# Patient Record
Sex: Male | Born: 1970
Health system: Southern US, Community
[De-identification: ages and names within clinical notes are randomized; demographics above are authoritative.]

## PROBLEM LIST (undated history)

## (undated) DIAGNOSIS — C801 Malignant (primary) neoplasm, unspecified: Secondary | ICD-10-CM

## (undated) DIAGNOSIS — Z923 Personal history of irradiation: Secondary | ICD-10-CM

## (undated) DIAGNOSIS — T4145XA Adverse effect of unspecified anesthetic, initial encounter: Secondary | ICD-10-CM

## (undated) DIAGNOSIS — I10 Essential (primary) hypertension: Secondary | ICD-10-CM

## (undated) DIAGNOSIS — C6290 Malignant neoplasm of unspecified testis, unspecified whether descended or undescended: Secondary | ICD-10-CM

## (undated) DIAGNOSIS — T8859XA Other complications of anesthesia, initial encounter: Secondary | ICD-10-CM

## (undated) HISTORY — DX: Malignant (primary) neoplasm, unspecified: C80.1

## (undated) HISTORY — DX: Personal history of irradiation: Z92.3

## (undated) HISTORY — DX: Malignant neoplasm of unspecified testis, unspecified whether descended or undescended: C62.90

## (undated) HISTORY — PX: EYE SURGERY: SHX253

## (undated) HISTORY — DX: Essential (primary) hypertension: I10

---
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
## 2010-05-12 ENCOUNTER — Ambulatory Visit (HOSPITAL_BASED_OUTPATIENT_CLINIC_OR_DEPARTMENT_OTHER)
Admission: RE | Admit: 2010-05-12 | Discharge: 2010-05-12 | Payer: Self-pay | Source: Home / Self Care | Attending: Internal Medicine | Admitting: Internal Medicine

## 2010-05-12 ENCOUNTER — Encounter: Payer: Self-pay | Admitting: Internal Medicine

## 2010-05-12 ENCOUNTER — Telehealth: Payer: Self-pay | Admitting: Internal Medicine

## 2010-05-12 ENCOUNTER — Ambulatory Visit: Payer: Self-pay | Admitting: Internal Medicine

## 2010-05-12 DIAGNOSIS — I10 Essential (primary) hypertension: Secondary | ICD-10-CM

## 2010-05-12 DIAGNOSIS — C6290 Malignant neoplasm of unspecified testis, unspecified whether descended or undescended: Secondary | ICD-10-CM | POA: Insufficient documentation

## 2010-05-12 LAB — CONVERTED CEMR LAB
AST: 22 units/L (ref 0–37)
Albumin: 4.6 g/dL (ref 3.5–5.2)
Alkaline Phosphatase: 68 units/L (ref 39–117)
Calcium: 9.1 mg/dL (ref 8.4–10.5)
Cholesterol: 188 mg/dL (ref 0–200)
Creatinine, Ser: 0.8 mg/dL (ref 0.40–1.50)
Glucose, Bld: 77 mg/dL (ref 70–99)
HCT: 47.4 % (ref 39.0–52.0)
LDH: 185 units/L (ref 94–250)
RBC: 5.24 M/uL (ref 4.22–5.81)
RDW: 12.4 % (ref 11.5–15.5)
TSH: 1.504 microintl units/mL (ref 0.350–4.500)
Total Bilirubin: 0.6 mg/dL (ref 0.3–1.2)
Total Protein: 7 g/dL (ref 6.0–8.3)
VLDL: 18 mg/dL (ref 0–40)
WBC: 6.1 10*3/uL (ref 4.0–10.5)
hCG, Beta Chain, Quant, S: <2 milliintl units/mL

## 2010-05-13 ENCOUNTER — Encounter: Payer: Self-pay | Admitting: Internal Medicine

## 2010-05-17 ENCOUNTER — Encounter: Payer: Self-pay | Admitting: Internal Medicine

## 2010-05-19 ENCOUNTER — Ambulatory Visit
Admission: RE | Admit: 2010-05-19 | Discharge: 2010-05-19 | Payer: Self-pay | Source: Home / Self Care | Attending: Urology | Admitting: Urology

## 2010-05-19 ENCOUNTER — Encounter: Payer: Self-pay | Admitting: Internal Medicine

## 2010-05-19 DIAGNOSIS — C801 Malignant (primary) neoplasm, unspecified: Secondary | ICD-10-CM

## 2010-05-19 HISTORY — DX: Malignant (primary) neoplasm, unspecified: C80.1

## 2010-05-19 HISTORY — PX: TESTICULAR EXPLORATION: SHX5145

## 2010-05-19 HISTORY — PX: ORCHIECTOMY: SHX2116

## 2010-06-02 ENCOUNTER — Ambulatory Visit
Admission: RE | Admit: 2010-06-02 | Discharge: 2010-06-28 | Payer: Self-pay | Source: Home / Self Care | Attending: Radiation Oncology | Admitting: Radiation Oncology

## 2010-06-02 ENCOUNTER — Encounter: Payer: Self-pay | Admitting: Internal Medicine

## 2010-06-03 ENCOUNTER — Encounter: Payer: Self-pay | Admitting: Internal Medicine

## 2010-06-29 ENCOUNTER — Ambulatory Visit: Payer: BC Managed Care – PPO | Attending: Radiation Oncology | Admitting: Radiation Oncology

## 2010-06-29 DIAGNOSIS — Z510 Encounter for antineoplastic radiation therapy: Secondary | ICD-10-CM | POA: Insufficient documentation

## 2010-06-29 DIAGNOSIS — R110 Nausea: Secondary | ICD-10-CM | POA: Insufficient documentation

## 2010-06-29 DIAGNOSIS — C6290 Malignant neoplasm of unspecified testis, unspecified whether descended or undescended: Secondary | ICD-10-CM | POA: Insufficient documentation

## 2010-06-30 NOTE — Letter (Signed)
   [place] at [facility] [address] [place], [place]  [address]  [place] Phone: [phone]      May 13, 2010   Johaan Triggs [address] [place], [place] [address]  RE:  LAB RESULTS  Dear  Mr. Barbara,  The following is an interpretation of your most recent lab tests.  Please take note of any instructions provided or changes to medications that have resulted from your lab work.  ELECTROLYTES:  Good - no changes needed  KIDNEY FUNCTION TESTS:  Good - no changes needed  LIVER FUNCTION TESTS:  Good - no changes needed  LIPID PANEL:  Fair - review at your next visit Triglyceride: 88   Cholesterol: 188   LDL: 127   HDL: 43   Chol/HDL%:  4.4 Ratio  THYROID STUDIES:  Thyroid studies normal TSH: 1.504     CBC:  Good - no changes needed  Tumor markers - normal       Sincerely Yours,    Dr. Thomos Lemons  Appended Document:  Mailed.

## 2010-06-30 NOTE — Assessment & Plan Note (Signed)
Summary: TO EST/CPX/hea   Vital Signs:  Patient profile:   40 year old male Height:      69.5 inches Weight:      187.25 pounds BMI:     27.35 O2 Sat:      100 % on Room air Temp:     98.0 degrees F oral Pulse rate:   65 / minute Resp:     20 per minute BP sitting:   142 / 100  (right arm) Cuff size:   large  Vitals Entered By: Glendell Docker CMA (May 12, 2010 9:20 AM)  O2 Flow:  Room air CC: New Patient Is Patient Diabetic? No Pain Assessment Patient in pain? no       Does patient need assistance? Functional Status Self care Comments left testicle discomfort  for the past 3-4 weeks, states that it feels hard, denies urinary symptoms of any kind   Primary Care Provider:  Dondra Spry DO  CC:  New Patient.  History of Present Illness: 40 y/o white male to establish  he reports left testicular discomfort x 3-4 weeks left testicle feels different no trauma or injury no change in sexual function, no urinary symptoms  more symptomatic after lifting - son has dirt bike that weighs 90 lbs  Preventive Screening-Counseling & Management  Alcohol-Tobacco     Alcohol drinks/day: <1     Smoking Status: never  Caffeine-Diet-Exercise     Caffeine use/day: 2 beverages daily     Does Patient Exercise: yes     Type of exercise: Run 3 - 4 miles     Times/week: 3  Allergies (verified): No Known Drug Allergies  Past History:  Past Medical History: Hypertension - diagnosed at age 40  (bp was 160/110 when pt first diagnosed) ( workup for secondary hypertension  - negative )  Past Surgical History: eye surgery age 40  Family History: Family History Diabetes 1st degree relative - MGF Family History Hypertension - mom and dad Family History of Stroke M - MGF no colon ca no breast MGM - ovarian cancer  Social History: Occupation: Medical illustrator ( lumbar business ) Married 15 years 2 boys 2 ,8 1 daughter 87 Never Smoked Alcohol use-yes (drinks mainly on weekends  - 5-6 on weekend) Smoking Status:  never Caffeine use/day:  2 beverages daily Does Patient Exercise:  yes  Review of Systems  The patient denies weight loss, weight gain, chest pain, dyspnea on exertion, abdominal pain, melena, hematochezia, and severe indigestion/heartburn.         occ heartburn with spicy foods.  no dysphagia  Physical Exam  General:  alert, well-developed, and well-nourished.   Head:  normocephalic and atraumatic.   Eyes:  pupils equal, pupils round, and pupils reactive to light.   Ears:  R ear normal and L ear normal.   Mouth:  pharynx pink and moist.   Lungs:  normal respiratory effort, normal breath sounds, no crackles, and no wheezes.   Heart:  normal rate, regular rhythm, and no murmur.   Abdomen:  soft, non-tender, normal bowel sounds, and no masses.  small umbilical hernia   Impression & Recommendations:  Problem # 1:  HYPERTENSION (ICD-401.9) restart amlodipine.  we discussed possible combination therapy  His updated medication list for this problem includes:    Amlodipine Besylate 5 Mg Tabs (Amlodipine besylate) ..... One by mouth once daily  Orders: T-Basic Metabolic Panel [phone]) T-Hepatic Function [id number]) T-Lipid Profile [id number]) T-CBC No Diff ([id number]) T-TSH [phone]) CRP, high  sensitivity-[facility] [phone])  BP today: 142/100  Problem # 2:  TESTICULAR MASS, LEFT (ICD-608.89) firm area base of left testicle.  rule out testicular cancer check AFP, B Hcg, and LDH  Orders: Ultrasound (Ultrasound) T- * Misc. Laboratory test [phone])  Complete Medication List: 1)  Amlodipine Besylate 5 Mg Tabs (Amlodipine besylate) .... One by mouth once daily  Patient Instructions: 1)  Please schedule a follow-up appointment in 1 month. Prescriptions: AMLODIPINE BESYLATE 5 MG TABS (AMLODIPINE BESYLATE) one by mouth once daily  #30 x 3   Entered and Authorized by:   D. Thomos Lemons DO   Signed by:   D. Thomos Lemons DO on  05/12/2010   Method used:   Electronically to        [facility] [phone]* (retail)       [address]       [place], [place]  [address]       Ph: [phone]       Fax: [phone]   RxID:   [id number]    Orders Added: 1)  Ultrasound [Ultrasound] 2)  T-Basic Metabolic Panel [[id number]] 3)  T-Hepatic Function [[id number]] 4)  T-Lipid Profile [[id number]] 5)  T-CBC No Diff [85027-10000] 6)  T-TSH [51884-16606] 7)  T- * Misc. Laboratory test [99999] 8)  CRP, high sensitivity-[facility] [phone] 9)  New Patient Level III [[address]203]    Current Allergies (reviewed today): No known allergies

## 2010-06-30 NOTE — Consult Note (Signed)
Summary: [facility]  [facility]   Imported By: Lanelle Bal 05/27/2010 13:10:48  _____________________________________________________________________  External Attachment:    Type:   Image     Comment:   External Document

## 2010-06-30 NOTE — Progress Notes (Signed)
Summary: Jordan Beasley on phone for you   Phone Note From Other Clinic   Caller: Jordan Beasley with Radiology  Call For: yoo Summary of Call: received call from radiologist - left testicular mass suspicous for cancer.  spoke with pt re: u/s results.  arrange urologic eval  Initial call taken by: D. Thomos Lemons DO,  May 12, 2010 1:48 PM  Follow-up for Phone Call        Appt  [facility]   Dr  Retta Diones   Dec  20th   pt notified and confirmed   Records fax Follow-up by: Darral Dash,  May 13, 2010 8:07 AM

## 2010-06-30 NOTE — Letter (Signed)
Summary: [facility]  [facility]   Imported By: Lanelle Bal 06/21/2010 13:54:23  _____________________________________________________________________  External Attachment:    Type:   Image     Comment:   External Document

## 2010-06-30 NOTE — Letter (Signed)
Summary: [facility]  [facility]   Imported By: Lanelle Bal 06/14/2010 08:53:01  _____________________________________________________________________  External Attachment:    Type:   Image     Comment:   External Document

## 2010-07-08 ENCOUNTER — Encounter: Payer: Self-pay | Admitting: Internal Medicine

## 2010-07-15 ENCOUNTER — Encounter: Payer: Self-pay | Admitting: Internal Medicine

## 2010-07-15 ENCOUNTER — Ambulatory Visit (INDEPENDENT_AMBULATORY_CARE_PROVIDER_SITE_OTHER): Payer: BC Managed Care – PPO | Admitting: Internal Medicine

## 2010-07-15 DIAGNOSIS — E785 Hyperlipidemia, unspecified: Secondary | ICD-10-CM

## 2010-07-15 DIAGNOSIS — C6290 Malignant neoplasm of unspecified testis, unspecified whether descended or undescended: Secondary | ICD-10-CM

## 2010-07-26 NOTE — Letter (Signed)
Summary: [facility]  [facility]   Imported By: Maryln Gottron 07/21/2010 13:39:42  _____________________________________________________________________  External Attachment:    Type:   Image     Comment:   External Document

## 2010-07-28 ENCOUNTER — Encounter: Payer: Self-pay | Admitting: Internal Medicine

## 2010-08-04 NOTE — Assessment & Plan Note (Signed)
Summary: 1 MONTH FOLLOW UP/MHF   Vital Signs:  Patient profile:   40 year old male Height:      69.5 inches Weight:      192.25 pounds BMI:     28.08 O2 Sat:      100 % on Room air Temp:     98.2 degrees F oral Pulse rate:   79 / minute Resp:     16 per minute BP sitting:   130 / 72  (right arm) Cuff size:   large  Vitals Entered By: Glendell Docker CMA (July 15, 2010 8:25 AM)  O2 Flow:  Room air CC: 1 Month Follow up Is Patient Diabetic? No Pain Assessment Patient in pain? no      Comments no concerns   Primary Care Provider:  Dondra Spry DO  CC:  1 Month Follow up.  History of Present Illness: 40 y/o for f/u pt diagnosed with classic seminoma s/p orchiectomy and radiation tolerated well.     reviewed labs cholesterol LDL borderline.  CRP elevated  protein source - red meat , cheese   Preventive Screening-Counseling & Management  Alcohol-Tobacco     Smoking Status: never  Allergies (verified): No Known Drug Allergies  Past History:  Past Medical History: Hypertension - diagnosed at age 37  (bp was 160/110 when pt first diagnosed) ( workup for secondary hypertension  - negative )    Past Surgical History: eye surgery age 47  SH/Risk Factors reviewed for relevance  Family History: Family History Diabetes 1st degree relative - MGF Family History Hypertension - mom and dad Family History of Stroke M - MGF no colon ca no breast MGM - ovarian cancer   Social History: Occupation: Medical illustrator ( lumbar business ) Married 15 years 2 boys 2 ,8 (son involved in motor cross / dirt bike) 1 daughter 15 (daugther plays soccer) Never Smoked Alcohol use-yes (drinks mainly on weekends - 5-6 on weekend)  Physical Exam  General:  alert, well-developed, and well-nourished.   Lungs:  normal respiratory effort, normal breath sounds, no crackles, and no wheezes.   Heart:  normal rate, regular rhythm, and no murmur.   Extremities:  No lower extremity  edema   Impression & Recommendations:  Problem # 1:  SEMINOMA (ICD-186.9) status post left orchiectomy and radiation treatment Surveillance as per urology  Problem # 2:  HYPERLIPIDEMIA (ICD-272.4) patient defers start of statin Trial of lifestyle/dietary changes Educational handout provided  Labs Reviewed: SGOT: 22 (05/12/2010)   SGPT: 18 (05/12/2010)   HDL:43 (05/12/2010)  LDL:127 (05/12/2010)  Chol:188 (05/12/2010)  Trig:88 (05/12/2010)  Complete Medication List: 1)  Amlodipine Besylate 5 Mg Tabs (Amlodipine besylate) .... One by mouth once daily  Patient Instructions: 1)  Please schedule a follow-up appointment in 6 months. 2)  Lipid Panel prior to visit, ICD-9:  272.4 3)  High sensitivity CRP :  272.4 4)  Please return for lab work one (1) week before your next appointment.    Orders Added: 1)  Est. Patient Level III [[address]]    Current Allergies (reviewed today): No known allergies

## 2010-08-08 LAB — POCT I-STAT 4, (NA,K, GLUC, HGB,HCT)
HCT: 53 % — ABNORMAL HIGH (ref 39.0–52.0)
Potassium: 3.8 mEq/L (ref 3.5–5.1)
Sodium: 144 mEq/L (ref 135–145)

## 2010-08-09 NOTE — Letter (Signed)
Summary: [facility]  [facility]   Imported By: Maryln Gottron 08/05/2010 15:33:56  _____________________________________________________________________  External Attachment:    Type:   Image     Comment:   External Document

## 2010-08-19 ENCOUNTER — Ambulatory Visit: Payer: BC Managed Care – PPO | Attending: Radiation Oncology | Admitting: Radiation Oncology

## 2010-09-14 ENCOUNTER — Other Ambulatory Visit: Payer: Self-pay | Admitting: Internal Medicine

## 2010-09-20 ENCOUNTER — Ambulatory Visit (HOSPITAL_COMMUNITY)
Admission: RE | Admit: 2010-09-20 | Discharge: 2010-09-20 | Disposition: A | Discharge status: Home / Self Care | Payer: BC Managed Care – PPO | Source: Ambulatory Visit | Attending: Urology | Admitting: Urology

## 2010-09-20 ENCOUNTER — Other Ambulatory Visit: Payer: Self-pay | Admitting: Urology

## 2010-09-20 DIAGNOSIS — C6290 Malignant neoplasm of unspecified testis, unspecified whether descended or undescended: Secondary | ICD-10-CM

## 2010-09-20 DIAGNOSIS — I10 Essential (primary) hypertension: Secondary | ICD-10-CM | POA: Insufficient documentation

## 2010-12-26 ENCOUNTER — Ambulatory Visit (HOSPITAL_COMMUNITY)
Admission: RE | Admit: 2010-12-26 | Discharge: 2010-12-26 | Disposition: A | Discharge status: Home / Self Care | Payer: BC Managed Care – PPO | Source: Ambulatory Visit | Attending: Urology | Admitting: Urology

## 2010-12-26 ENCOUNTER — Other Ambulatory Visit: Payer: Self-pay | Admitting: Urology

## 2010-12-26 DIAGNOSIS — C6290 Malignant neoplasm of unspecified testis, unspecified whether descended or undescended: Secondary | ICD-10-CM

## 2011-02-23 ENCOUNTER — Other Ambulatory Visit: Payer: Self-pay | Admitting: Internal Medicine

## 2011-02-24 ENCOUNTER — Other Ambulatory Visit: Payer: Self-pay | Admitting: Radiation Oncology

## 2011-02-24 ENCOUNTER — Ambulatory Visit
Admission: RE | Admit: 2011-02-24 | Discharge: 2011-02-24 | Disposition: A | Discharge status: Home / Self Care | Payer: BC Managed Care – PPO | Source: Ambulatory Visit | Attending: Radiation Oncology | Admitting: Radiation Oncology

## 2011-02-24 DIAGNOSIS — C6290 Malignant neoplasm of unspecified testis, unspecified whether descended or undescended: Secondary | ICD-10-CM

## 2011-02-28 ENCOUNTER — Other Ambulatory Visit: Payer: Self-pay | Admitting: Internal Medicine

## 2011-03-13 ENCOUNTER — Telehealth: Payer: Self-pay | Admitting: Internal Medicine

## 2011-03-13 NOTE — Telephone Encounter (Signed)
Patient has a 1 year follow up scheduled with Dr. Artist Pais on 07/26/11. Patient is unsure if he will need labs prior to visit.

## 2011-03-13 NOTE — Telephone Encounter (Signed)
cpx labs one week prior

## 2011-07-05 ENCOUNTER — Other Ambulatory Visit: Payer: Self-pay | Admitting: Urology

## 2011-07-05 ENCOUNTER — Ambulatory Visit (HOSPITAL_COMMUNITY)
Admission: RE | Admit: 2011-07-05 | Discharge: 2011-07-05 | Disposition: A | Discharge status: Home / Self Care | Payer: BC Managed Care – PPO | Source: Ambulatory Visit | Attending: Urology | Admitting: Urology

## 2011-07-05 DIAGNOSIS — C6290 Malignant neoplasm of unspecified testis, unspecified whether descended or undescended: Secondary | ICD-10-CM | POA: Insufficient documentation

## 2011-07-05 DIAGNOSIS — I10 Essential (primary) hypertension: Secondary | ICD-10-CM | POA: Insufficient documentation

## 2011-07-14 ENCOUNTER — Ambulatory Visit: Payer: BC Managed Care – PPO | Admitting: Internal Medicine

## 2011-07-19 ENCOUNTER — Other Ambulatory Visit (INDEPENDENT_AMBULATORY_CARE_PROVIDER_SITE_OTHER): Payer: BC Managed Care – PPO

## 2011-07-19 DIAGNOSIS — Z0000 Encounter for general adult medical examination without abnormal findings: Secondary | ICD-10-CM

## 2011-07-19 LAB — CBC WITH DIFFERENTIAL/PLATELET
Basophils Relative: 0.5 % (ref 0.0–3.0)
Eosinophils Relative: 3.7 % (ref 0.0–5.0)
HCT: 47.3 % (ref 39.0–52.0)
Hemoglobin: 16 g/dL (ref 13.0–17.0)
Lymphocytes Relative: 22.8 % (ref 12.0–46.0)
MCV: 91.7 fl (ref 78.0–100.0)
Monocytes Relative: 8.2 % (ref 3.0–12.0)
Neutrophils Relative %: 64.8 % (ref 43.0–77.0)
Platelets: 186 10*3/uL (ref 150.0–400.0)
RBC: 5.16 Mil/uL (ref 4.22–5.81)
RDW: 13.5 % (ref 11.5–14.6)
WBC: 4 10*3/uL — ABNORMAL LOW (ref 4.5–10.5)

## 2011-07-19 LAB — HEPATIC FUNCTION PANEL
AST: 30 U/L (ref 0–37)
Albumin: 4.5 g/dL (ref 3.5–5.2)
Alkaline Phosphatase: 59 U/L (ref 39–117)
Bilirubin, Direct: 0 mg/dL (ref 0.0–0.3)

## 2011-07-19 LAB — BASIC METABOLIC PANEL
BUN: 16 mg/dL (ref 6–23)
Creatinine, Ser: 1.1 mg/dL (ref 0.4–1.5)
GFR: 82.23 mL/min (ref >60.00)

## 2011-07-19 LAB — POCT URINALYSIS DIPSTICK
Bilirubin, UA: NEGATIVE
Blood, UA: NEGATIVE
Ketones, UA: NEGATIVE
Nitrite, UA: NEGATIVE
Protein, UA: NEGATIVE
Urobilinogen, UA: 0.2

## 2011-07-19 LAB — LIPID PANEL: Triglycerides: 111 mg/dL (ref 0.0–149.0)

## 2011-07-19 LAB — LDL CHOLESTEROL, DIRECT: Direct LDL: 150.9 mg/dL

## 2011-07-26 ENCOUNTER — Encounter: Payer: Self-pay | Admitting: Internal Medicine

## 2011-07-26 ENCOUNTER — Ambulatory Visit (INDEPENDENT_AMBULATORY_CARE_PROVIDER_SITE_OTHER): Payer: BC Managed Care – PPO | Admitting: Internal Medicine

## 2011-07-26 DIAGNOSIS — C6290 Malignant neoplasm of unspecified testis, unspecified whether descended or undescended: Secondary | ICD-10-CM

## 2011-07-26 DIAGNOSIS — Z0000 Encounter for general adult medical examination without abnormal findings: Secondary | ICD-10-CM | POA: Insufficient documentation

## 2011-07-26 DIAGNOSIS — I10 Essential (primary) hypertension: Secondary | ICD-10-CM

## 2011-07-26 MED ORDER — AMLODIPINE BESYLATE 5 MG PO TABS: 5.0000 mg | ORAL_TABLET | Freq: Every day | ORAL | Status: DC

## 2011-07-26 MED ORDER — LOSARTAN POTASSIUM 50 MG PO TABS: 50.0000 mg | ORAL_TABLET | Freq: Every day | ORAL | Status: DC

## 2011-07-26 NOTE — Assessment & Plan Note (Signed)
History of left testicular seminoma.  SP left orchiectomy and radiation treatment.  Follow up with oncology for surveillance.

## 2011-07-26 NOTE — Assessment & Plan Note (Signed)
Suboptimally controlled.  Add losartan.  Reassess in 6 wks.  BMET before OV. BP: 152/82 mmHg  Lab Results  Component Value Date   CREATININE 1.1 07/19/2011

## 2011-07-26 NOTE — Progress Notes (Signed)
  Subjective:    Patient ID: Jordan Beasley, male    DOB: 1970-09-07, 41 y.o.   MRN: [id number]  HPI  41 year old white male with history of hypertension and left testicular seminoma for routine physical. Since previous visit patient has had followup with his urologist and oncologist. He is status post left orchiectomy and multiple radiation treatments. There has been no sign of recurrence of testicular cancer. He has followup with his oncologist next month. Overall he is feeling well.  Hypertension-good medication compliance. Blood pressure is still suboptimally controlled. Home readings 140s systolic.  Laboratory results reviewed. Patient has been trying to follow a low saturated fat diet. He has significantly reduced his intake of red meats.   Review of Systems  Constitutional: Negative for activity change, appetite change and unexpected weight change.  Eyes: Negative for visual disturbance.  Respiratory: Negative for cough, chest tightness and shortness of breath.   Cardiovascular: Negative for chest pain.  Genitourinary: Negative for difficulty urinating.  Neurological: Negative for headaches.  Gastrointestinal: Negative for abdominal pain, heartburn melena or hematochezia Psych: Negative for depression or anxiety  Past Medical History  Diagnosis Date  . Hypertension   . Seminoma of testis     History   Social History  . Marital Status: Married    Spouse Name: N/A    Number of Children: N/A  . Years of Education: N/A   Occupational History  . Not on file.   Social History Main Topics  . Smoking status: Never Smoker   . Smokeless tobacco: Not on file  . Alcohol Use: Yes  . Drug Use: No  . Sexually Active: Not on file   Other Topics Concern  . Not on file   Social History Narrative  . No narrative on file    Past Surgical History  Procedure Date  . Eye surgery     age 32    Family History  Problem Relation Age of Onset  . Hypertension Mother   . Stroke  Mother   . Hypertension Father   . Cancer Maternal Grandmother     ovarian  . Diabetes Maternal Grandfather   . Stroke Maternal Grandfather   . Colon cancer Neg Hx   . Breast cancer Neg Hx     No Known Allergies  No current outpatient prescriptions on file prior to visit.    BP 152/82  Pulse 72  Temp(Src) 97.8 F (36.6 C) (Oral)  Ht 5\' 10"  (1.778 m)  Wt 190 lb (86.183 kg)  BMI 27.26 kg/m2     Objective:   Physical Exam   Constitutional: Appears well-developed and well-nourished. No distress.  Head: Normocephalic and atraumatic.  Ear:  Right and left ear normal.  TMs clear.  Hearing is grossly normal Mouth/Throat: Oropharynx is clear and moist.  Eyes: Conjunctivae are normal. Pupils are equal, round, and reactive to light.  Neck: Normal range of motion. Neck supple. No thyromegaly present. No carotid bruit Cardiovascular: Normal rate, regular rhythm and normal heart sounds.  Exam reveals no gallop and no friction rub.  No murmur heard. Pulmonary/Chest: Effort normal and breath sounds normal.  No wheezes. No rales.  Abdominal: Soft. Bowel sounds are normal. No mass. There is no tenderness.  Neurological: Alert. No cranial nerve deficit.  Skin: Skin is warm and dry.  Psychiatric: Normal mood and affect. Behavior is normal.      Assessment & Plan:

## 2011-07-26 NOTE — Patient Instructions (Signed)
Please complete the following lab tests before your next follow up appointment: BMET - 401.9 High sensitivity CRP - 272.4

## 2011-07-26 NOTE — Assessment & Plan Note (Signed)
Reviewed adult health maintenance protocols.  Continue low saturated fat diet.   Further risk stratify with CRP.  We discussed possibly starting statin.

## 2011-08-21 ENCOUNTER — Ambulatory Visit (HOSPITAL_COMMUNITY)
Admission: RE | Admit: 2011-08-21 | Discharge: 2011-08-21 | Disposition: A | Discharge status: Home / Self Care | Payer: BC Managed Care – PPO | Source: Ambulatory Visit | Attending: Radiation Oncology | Admitting: Radiation Oncology

## 2011-08-21 DIAGNOSIS — K7689 Other specified diseases of liver: Secondary | ICD-10-CM | POA: Insufficient documentation

## 2011-08-21 DIAGNOSIS — C6290 Malignant neoplasm of unspecified testis, unspecified whether descended or undescended: Secondary | ICD-10-CM | POA: Insufficient documentation

## 2011-08-21 DIAGNOSIS — Q619 Cystic kidney disease, unspecified: Secondary | ICD-10-CM | POA: Insufficient documentation

## 2011-08-21 MED ORDER — IOHEXOL 300 MG/ML  SOLN
100.0000 mL | Freq: Once | INTRAMUSCULAR | Status: AC | PRN
  Administered 2011-08-21: 100 mL via INTRAVENOUS

## 2011-08-22 ENCOUNTER — Encounter: Payer: Self-pay | Admitting: *Deleted

## 2011-08-22 DIAGNOSIS — Z923 Personal history of irradiation: Secondary | ICD-10-CM | POA: Insufficient documentation

## 2011-08-25 ENCOUNTER — Ambulatory Visit
Admission: RE | Admit: 2011-08-25 | Discharge: 2011-08-25 | Disposition: A | Discharge status: Home / Self Care | Payer: BC Managed Care – PPO | Source: Ambulatory Visit | Attending: Radiation Oncology | Admitting: Radiation Oncology

## 2011-08-25 ENCOUNTER — Encounter: Payer: Self-pay | Admitting: Radiation Oncology

## 2011-08-25 VITALS — BP 154/92 | HR 63 | Temp 98.3°F | Resp 20 | Ht 73.0 in | Wt 186.7 lb

## 2011-08-25 DIAGNOSIS — C6290 Malignant neoplasm of unspecified testis, unspecified whether descended or undescended: Secondary | ICD-10-CM

## 2011-08-25 NOTE — Progress Notes (Signed)
Radiation Oncology         [phone] ________________________________  Name: Jordan Beasley MRN: [id number]  Date: 08/25/2011  DOB: 19-Feb-1971  Follow-Up Visit Note  CC: Thomos Lemons, DO, DO  Dahlstedt, Jeannett Senior, MD  Diagnosis:   Classical seminoma of the left testis, T2 N0  Interval Since Last Radiation:  13 months   Narrative:  The patient returns today for routine follow-up.  Patient indicates that he is doing very well. He has no significant complaints today. No nausea and no diarrhea. The patient denies any significant pain which is new or severe. The patient did have a CT scan of the abdomen and pelvis which was completed on 08/21/2011. There is no indication of metastatic disease within the abdomen or pelvis.                              ALLERGIES:   has no known allergies.  Meds: Current Outpatient Prescriptions  Medication Sig Dispense Refill  . amLODipine (NORVASC) 5 MG tablet Take 1 tablet (5 mg total) by mouth daily.  90 tablet  1  . losartan (COZAAR) 50 MG tablet Take 1 tablet (50 mg total) by mouth daily.  30 tablet  2    Physical Findings: The patient is in no acute distress. Patient is alert and oriented.  height is 6\' 1"  (1.854 m) and weight is 186 lb 11.2 oz (84.687 kg). His oral temperature is 98.3 F (36.8 C). His blood pressure is 154/92 and his pulse is 63. His respiration is 20. Marland Kitchen   General: Well-developed and in no acute distress Neck: Supple without any lymphadenopathy Cardiovascular: Regular rate and rhythm Respiratory: Clear to auscultation bilaterally GI: Soft, nontender, normal bowel sounds Extremities: No edema present Neuro: No focal deficits GU exam deferred due to recent exam by Dr. Retta Diones   Lab Findings: Lab Results  Component Value Date   WBC 4.0* 07/19/2011   HGB 16.0 07/19/2011   HCT 47.3 07/19/2011   MCV 91.7 07/19/2011   PLT 186.0 07/19/2011     Radiographic Findings: Ct Abdomen Pelvis W Contrast  08/21/2011  *RADIOLOGY REPORT*   Clinical Data: History of testicular cancer.  Status post left orchiectomy.  CT ABDOMEN AND PELVIS WITH CONTRAST  Technique:  Multidetector CT imaging of the abdomen and pelvis was performed following the standard protocol during bolus administration of intravenous contrast.  Contrast:  100 ml of Omnipaque-300.  Comparison: CT of abdomen and pelvis 05/18/2010.  Findings:  Lung Bases: Image 3 of series 5 demonstrates a 3 mm subpleural nodule in the anterior aspect of the right lower lobe.  This nodule is completely unchanged in retrospect compared to prior CT scan 05/18/2010, and is favored to represent a benign subpleural lymph node.  No other suspicious appearing pulmonary nodule or mass is identified within the visualized portions of the lung bases.  Abdomen/Pelvis:  Again noted is a 7 mm well circumscribed low attenuation lesion in segment 7 of the liver (image 17 of series 2), which is unchanged compared to the prior examination.  No new hepatic lesions are otherwise noted.  The infused appearance of the gallbladder, pancreas, spleen, bilateral adrenal glands and bilateral kidneys is unremarkable.  No abnormal soft tissue masses or pathologic adenopathy are noted within the abdomen or pelvis. No ascites or pneumoperitoneum and no pathologic distension of bowel.  Retroaortic left renal vein (normal anatomical variant) incidentally noted.  Postoperative changes of left-sided orchiectomy.  Musculoskeletal: There are no aggressive appearing lytic or blastic lesions noted in the visualized portions of the skeleton.  IMPRESSION: 1.  No findings to suggest metastatic disease in the abdomen or pelvis on today's examination. 2.  Unchanged 7 mm low attenuation lesion in segment 7 of the liver remains too small to characterize, however, its stability in size over time would suggest a benign lesion such as a small cyst. 3.  While there is a tiny 3 mm subpleural nodule in the anterior aspect of the right lower lobe, this is  completely unchanged in retrospect compared to prior study 05/18/2010, and is therefore favored to represent a benign subpleural lymph node.  No imaging follow-up for this nodule is recommended at this time.  Original Report Authenticated By: Florencia Reasons, M.D.    Impression:    The patient is doing very well with no signs of recurrence or metastatic disease.  Plan:  The patient will return to clinic in one year after undergoing a repeat CT scan of the abdomen and pelvis.  I spent 10 minutes with the patient today, the majority of which was spent counseling the patient on the diagnosis of cancer and coordinating care.   Radene Gunning, M.D., Ph.D.

## 2011-08-25 NOTE — Progress Notes (Signed)
Pt denies issues w.appetite, energy, bowels, bladder. Working full-time, exercising.

## 2011-08-29 ENCOUNTER — Telehealth: Payer: Self-pay | Admitting: *Deleted

## 2011-08-29 NOTE — Telephone Encounter (Signed)
XXXX 

## 2011-09-04 ENCOUNTER — Other Ambulatory Visit (INDEPENDENT_AMBULATORY_CARE_PROVIDER_SITE_OTHER): Payer: BC Managed Care – PPO

## 2011-09-04 DIAGNOSIS — I10 Essential (primary) hypertension: Secondary | ICD-10-CM

## 2011-09-04 DIAGNOSIS — E785 Hyperlipidemia, unspecified: Secondary | ICD-10-CM

## 2011-09-04 LAB — HIGH SENSITIVITY CRP: CRP, High Sensitivity: 2.3 mg/L (ref 0.000–5.000)

## 2011-09-04 LAB — BASIC METABOLIC PANEL
CO2: 23 mEq/L (ref 19–32)
Glucose, Bld: 89 mg/dL (ref 70–99)

## 2011-09-06 ENCOUNTER — Encounter: Payer: Self-pay | Admitting: Internal Medicine

## 2011-09-06 ENCOUNTER — Ambulatory Visit (INDEPENDENT_AMBULATORY_CARE_PROVIDER_SITE_OTHER): Payer: BC Managed Care – PPO | Admitting: Internal Medicine

## 2011-09-06 VITALS — BP 146/82 | Temp 97.9°F | Wt 191.0 lb

## 2011-09-06 DIAGNOSIS — I10 Essential (primary) hypertension: Secondary | ICD-10-CM

## 2011-09-06 MED ORDER — AMLODIPINE BESYLATE 5 MG PO TABS: 5.0000 mg | ORAL_TABLET | Freq: Every day | ORAL | Status: DC

## 2011-09-06 MED ORDER — VALSARTAN 160 MG PO TABS: 160.0000 mg | ORAL_TABLET | Freq: Every day | ORAL | Status: DC

## 2011-09-06 NOTE — Assessment & Plan Note (Signed)
Blood pressure is still suboptimally controlled. Change losartan to valsartan 160 mg once daily. Continue amlodipine. Patient defers starting statin for now. Patient to continue with low saturated fat diet and start over-the-counter omega-3 fatty acid capsules. Reassess lipid panel within 6  to 12 months. Lab Results  Component Value Date   CREATININE 1.0 09/04/2011   BP: 146/82 mmHg

## 2011-09-06 NOTE — Progress Notes (Signed)
  Subjective:    Patient ID: Jordan Beasley, male    DOB: Jun 22, 1970, 41 y.o.   MRN: [id number]  HPI  41 year old white male with hypertension for routine followup. Patient currently on amlodipine 5 mg and losartan 50 mg. He has been monitoring his blood pressure at home. Systolic blood pressure readings range from 130s to 140s. He denies any lightheadedness but complains of mild fatigue. He denies any erectile dysfunction.  Patient reports good medication compliance.  Laboratory results reviewed-electrolytes and kidney function are normal. Patient has mildly elevated CRP of 2.3.   Review of Systems See HPI  Past Medical History  Diagnosis Date  . Hypertension   . Seminoma of testis   . Cancer 05/19/10    seminoma L testicle  . History of radiation therapy 06/16/10 to 07/08/10    paraaortic region    History   Social History  . Marital Status: Married    Spouse Name: N/A    Number of Children: 3  . Years of Education: N/A   Occupational History  . SALES    Social History Main Topics  . Smoking status: Never Smoker   . Smokeless tobacco: Not on file  . Alcohol Use: Yes     occassional  . Drug Use: No  . Sexually Active: Not on file   Other Topics Concern  . Not on file   Social History Narrative  . No narrative on file    Past Surgical History  Procedure Date  . Eye surgery     age 30  . Orchiectomy 05/19/2010    left inguinal orchiectomy    Family History  Problem Relation Age of Onset  . Hypertension Mother   . Stroke Mother   . Hypertension Father   . Cancer Maternal Grandmother     ovarian  . Diabetes Maternal Grandfather   . Stroke Maternal Grandfather   . Colon cancer Neg Hx   . Breast cancer Neg Hx     No Known Allergies  Current Outpatient Prescriptions on File Prior to Visit  Medication Sig Dispense Refill  . amLODipine (NORVASC) 5 MG tablet Take 1 tablet (5 mg total) by mouth daily.  90 tablet  1  . valsartan (DIOVAN) 160 MG tablet Take 1  tablet (160 mg total) by mouth daily.  90 tablet  1    BP 146/82  Temp(Src) 97.9 F (36.6 C) (Oral)  Wt 191 lb (86.637 kg)       Objective:   Physical Exam  Constitutional: He appears well-developed and well-nourished.  Cardiovascular: Normal rate, regular rhythm and normal heart sounds.   No murmur heard. Pulmonary/Chest: Effort normal and breath sounds normal. He has no wheezes.  Musculoskeletal: He exhibits no edema.  Psychiatric: He has a normal mood and affect. His behavior is normal.          Assessment & Plan:

## 2011-11-16 ENCOUNTER — Encounter: Payer: Self-pay | Admitting: Internal Medicine

## 2011-11-16 ENCOUNTER — Ambulatory Visit (INDEPENDENT_AMBULATORY_CARE_PROVIDER_SITE_OTHER): Payer: BC Managed Care – PPO | Admitting: Internal Medicine

## 2011-11-16 VITALS — BP 138/80 | HR 64 | Temp 98.0°F | Wt 185.0 lb

## 2011-11-16 DIAGNOSIS — I10 Essential (primary) hypertension: Secondary | ICD-10-CM

## 2011-11-16 LAB — BASIC METABOLIC PANEL
BUN: 10 mg/dL (ref 6–23)
CO2: 26 mEq/L (ref 19–32)
Chloride: 104 mEq/L (ref 96–112)
Creatinine, Ser: 0.8 mg/dL (ref 0.4–1.5)
Potassium: 3.5 mEq/L (ref 3.5–5.1)
Sodium: 139 mEq/L (ref 135–145)

## 2011-11-16 MED ORDER — VALSARTAN 160 MG PO TABS: 160.0000 mg | ORAL_TABLET | Freq: Every day | ORAL | Status: DC

## 2011-11-16 MED ORDER — AMLODIPINE BESYLATE 5 MG PO TABS: 5.0000 mg | ORAL_TABLET | Freq: Every day | ORAL | Status: DC

## 2011-11-16 NOTE — Assessment & Plan Note (Signed)
Improved.  Home SBP readings in the 110's to 120's.  Continue valsartan 160 mg and amlodipine 5 mg.  Monitor BMET. BP: 138/80 mmHg

## 2011-11-16 NOTE — Patient Instructions (Addendum)
Please complete the following lab tests before your next follow up appointment: Lipid panel, LFTs - 272.4 

## 2011-11-16 NOTE — Progress Notes (Signed)
  Subjective:    Patient ID: Jordan Beasley, male    DOB: 28-Jan-1971, 41 y.o.   MRN: [id number]  HPI  41 year old white male with history of testicular cancer and hypertension for routine followup. At previous visit losartan 50 mg was discontinued and switch to valsartan 160 mg. He has been maintaining home blood pressure log. Morning blood pressures have been in the 110s to 120s. During the first several days of medication change he experienced mild dizziness but this has resolved.  He reports following a low saturated fat diet.  Review of Systems Negative for chest pain or shortness of breath  Past Medical History  Diagnosis Date  . Hypertension   . Seminoma of testis   . Cancer 05/19/10    seminoma L testicle  . History of radiation therapy 06/16/10 to 07/08/10    paraaortic region    History   Social History  . Marital Status: Married    Spouse Name: N/A    Number of Children: 3  . Years of Education: N/A   Occupational History  . SALES    Social History Main Topics  . Smoking status: Never Smoker   . Smokeless tobacco: Not on file  . Alcohol Use: Yes     occassional  . Drug Use: No  . Sexually Active: Not on file   Other Topics Concern  . Not on file   Social History Narrative  . No narrative on file    Past Surgical History  Procedure Date  . Eye surgery     age 71  . Orchiectomy 05/19/2010    left inguinal orchiectomy    Family History  Problem Relation Age of Onset  . Hypertension Mother   . Stroke Mother   . Hypertension Father   . Cancer Maternal Grandmother     ovarian  . Diabetes Maternal Grandfather   . Stroke Maternal Grandfather   . Colon cancer Neg Hx   . Breast cancer Neg Hx     No Known Allergies  Current Outpatient Prescriptions on File Prior to Visit  Medication Sig Dispense Refill  . DISCONTD: amLODipine (NORVASC) 5 MG tablet Take 1 tablet (5 mg total) by mouth daily.  90 tablet  1  . DISCONTD: valsartan (DIOVAN) 160 MG tablet  Take 1 tablet (160 mg total) by mouth daily.  90 tablet  1    BP 138/80  Pulse 64  Temp 98 F (36.7 C) (Oral)  Wt 185 lb (83.915 kg)       Objective:   Physical Exam  Constitutional: He appears well-developed and well-nourished.  Cardiovascular: Normal rate, regular rhythm and normal heart sounds.   Pulmonary/Chest: Effort normal and breath sounds normal. He has no wheezes.  Musculoskeletal: He exhibits no edema.          Assessment & Plan:

## 2012-01-09 ENCOUNTER — Ambulatory Visit (HOSPITAL_COMMUNITY)
Admission: RE | Admit: 2012-01-09 | Discharge: 2012-01-09 | Disposition: A | Discharge status: Home / Self Care | Payer: BC Managed Care – PPO | Source: Ambulatory Visit | Attending: Urology | Admitting: Urology

## 2012-01-09 ENCOUNTER — Other Ambulatory Visit: Payer: Self-pay | Admitting: Urology

## 2012-01-09 DIAGNOSIS — C6290 Malignant neoplasm of unspecified testis, unspecified whether descended or undescended: Secondary | ICD-10-CM | POA: Insufficient documentation

## 2012-03-11 ENCOUNTER — Other Ambulatory Visit: Payer: Self-pay | Admitting: Internal Medicine

## 2012-05-10 ENCOUNTER — Other Ambulatory Visit (INDEPENDENT_AMBULATORY_CARE_PROVIDER_SITE_OTHER): Payer: BC Managed Care – PPO

## 2012-05-10 DIAGNOSIS — E785 Hyperlipidemia, unspecified: Secondary | ICD-10-CM

## 2012-05-10 LAB — LIPID PANEL
HDL: 37.9 mg/dL — ABNORMAL LOW (ref >39.00)
Total CHOL/HDL Ratio: 5
Triglycerides: 100 mg/dL (ref 0.0–149.0)
VLDL: 20 mg/dL (ref 0.0–40.0)

## 2012-05-17 ENCOUNTER — Encounter: Payer: Self-pay | Admitting: Internal Medicine

## 2012-05-17 ENCOUNTER — Ambulatory Visit (INDEPENDENT_AMBULATORY_CARE_PROVIDER_SITE_OTHER): Payer: BC Managed Care – PPO | Admitting: Internal Medicine

## 2012-05-17 VITALS — BP 124/72 | HR 80 | Temp 98.0°F | Wt 172.0 lb

## 2012-05-17 DIAGNOSIS — I10 Essential (primary) hypertension: Secondary | ICD-10-CM

## 2012-05-17 MED ORDER — AMLODIPINE BESYLATE 5 MG PO TABS: 5.0000 mg | ORAL_TABLET | Freq: Every day | ORAL | Status: DC

## 2012-05-17 MED ORDER — VALSARTAN 160 MG PO TABS: 160.0000 mg | ORAL_TABLET | Freq: Every day | ORAL | Status: DC

## 2012-05-17 NOTE — Patient Instructions (Addendum)
Please complete the following lab tests before your next follow up appointment: BMET - 401.9 

## 2012-05-17 NOTE — Progress Notes (Signed)
  Subjective:    Patient ID: KIYOTO SLOMSKI, male    DOB: 07/03/1970, 41 y.o.   MRN: [id number]  HPI  41 year old white male with history of testicular cancer and hypertension for followup. Patient previously started on amlodipine 5 mg in addition to his Diovan 160 mg. Patient reports blood pressures have significantly improved. In fact they are on the low normal side. Systolic blood pressure readings are frequently in the 110s. His wife has noticed he is more sluggish than usual.  Lipid panel reviewed.  Review of Systems Negative for dizziness or chest pain  Past Medical History  Diagnosis Date  . Hypertension   . Seminoma of testis   . Cancer 05/19/10    seminoma L testicle  . History of radiation therapy 06/16/10 to 07/08/10    paraaortic region    History   Social History  . Marital Status: Married    Spouse Name: N/A    Number of Children: 3  . Years of Education: N/A   Occupational History  . SALES    Social History Main Topics  . Smoking status: Never Smoker   . Smokeless tobacco: Not on file  . Alcohol Use: Yes     Comment: occassional  . Drug Use: No  . Sexually Active: Not on file   Other Topics Concern  . Not on file   Social History Narrative  . No narrative on file    Past Surgical History  Procedure Date  . Eye surgery     age 58  . Orchiectomy 05/19/2010    left inguinal orchiectomy    Family History  Problem Relation Age of Onset  . Hypertension Mother   . Stroke Mother   . Hypertension Father   . Cancer Maternal Grandmother     ovarian  . Diabetes Maternal Grandfather   . Stroke Maternal Grandfather   . Colon cancer Neg Hx   . Breast cancer Neg Hx     No Known Allergies  Current Outpatient Prescriptions on File Prior to Visit  Medication Sig Dispense Refill  . amLODipine (NORVASC) 5 MG tablet Take 1 tablet (5 mg total) by mouth daily.  90 tablet  1  . valsartan (DIOVAN) 160 MG tablet Take 1 tablet (160 mg total) by mouth daily.  90  tablet  1    BP 124/72  Pulse 80  Temp 98 F (36.7 C) (Oral)  Wt 172 lb (78.019 kg)       Objective:   Physical Exam  Constitutional: He is oriented to person, place, and time. He appears well-developed and well-nourished.  Cardiovascular: Normal rate, regular rhythm and normal heart sounds.   Pulmonary/Chest: Effort normal and breath sounds normal.  Neurological: He is alert and oriented to person, place, and time.          Assessment & Plan:

## 2012-05-17 NOTE — Assessment & Plan Note (Addendum)
Blood pressures have improved but patient now feeling sluggish. His systolic blood pressure readings are in the 110s at home.  Reduce amlodipine dose to 2.5 mg.  Patient's 10 year cardiovascular risk 2.1%.  Continue following low saturated fat diet. BP: 124/72 mmHg

## 2012-07-13 ENCOUNTER — Other Ambulatory Visit: Payer: Self-pay

## 2012-07-22 ENCOUNTER — Ambulatory Visit (HOSPITAL_COMMUNITY)
Admission: RE | Admit: 2012-07-22 | Discharge: 2012-07-22 | Disposition: A | Discharge status: Home / Self Care | Payer: BC Managed Care – PPO | Source: Ambulatory Visit | Attending: Urology | Admitting: Urology

## 2012-07-22 ENCOUNTER — Other Ambulatory Visit: Payer: Self-pay | Admitting: Urology

## 2012-07-22 DIAGNOSIS — C6290 Malignant neoplasm of unspecified testis, unspecified whether descended or undescended: Secondary | ICD-10-CM

## 2012-07-22 DIAGNOSIS — I10 Essential (primary) hypertension: Secondary | ICD-10-CM | POA: Insufficient documentation

## 2012-07-22 DIAGNOSIS — Z8547 Personal history of malignant neoplasm of testis: Secondary | ICD-10-CM | POA: Insufficient documentation

## 2012-08-23 ENCOUNTER — Ambulatory Visit (HOSPITAL_COMMUNITY)
Admission: RE | Admit: 2012-08-23 | Discharge: 2012-08-23 | Disposition: A | Discharge status: Home / Self Care | Payer: BC Managed Care – PPO | Source: Ambulatory Visit | Attending: Radiation Oncology | Admitting: Radiation Oncology

## 2012-08-23 ENCOUNTER — Encounter (HOSPITAL_COMMUNITY): Payer: Self-pay

## 2012-08-23 DIAGNOSIS — C6290 Malignant neoplasm of unspecified testis, unspecified whether descended or undescended: Secondary | ICD-10-CM | POA: Insufficient documentation

## 2012-08-23 DIAGNOSIS — K429 Umbilical hernia without obstruction or gangrene: Secondary | ICD-10-CM | POA: Insufficient documentation

## 2012-08-23 DIAGNOSIS — J984 Other disorders of lung: Secondary | ICD-10-CM | POA: Insufficient documentation

## 2012-08-23 DIAGNOSIS — K7689 Other specified diseases of liver: Secondary | ICD-10-CM | POA: Insufficient documentation

## 2012-08-23 MED ORDER — IOHEXOL 300 MG/ML  SOLN
100.0000 mL | Freq: Once | INTRAMUSCULAR | Status: AC | PRN
  Administered 2012-08-23: 100 mL via INTRAVENOUS

## 2012-12-16 ENCOUNTER — Other Ambulatory Visit: Payer: Self-pay | Admitting: *Deleted

## 2012-12-16 DIAGNOSIS — I10 Essential (primary) hypertension: Secondary | ICD-10-CM

## 2012-12-16 MED ORDER — VALSARTAN 160 MG PO TABS: 160.0000 mg | ORAL_TABLET | Freq: Every day | ORAL | Status: DC

## 2013-01-30 ENCOUNTER — Other Ambulatory Visit: Payer: Self-pay | Admitting: Urology

## 2013-01-30 ENCOUNTER — Ambulatory Visit (HOSPITAL_COMMUNITY)
Admission: RE | Admit: 2013-01-30 | Discharge: 2013-01-30 | Disposition: A | Discharge status: Home / Self Care | Payer: BC Managed Care – PPO | Source: Ambulatory Visit | Attending: Urology | Admitting: Urology

## 2013-01-30 DIAGNOSIS — C6290 Malignant neoplasm of unspecified testis, unspecified whether descended or undescended: Secondary | ICD-10-CM | POA: Insufficient documentation

## 2013-03-20 ENCOUNTER — Other Ambulatory Visit: Payer: Self-pay | Admitting: Internal Medicine

## 2013-04-03 ENCOUNTER — Other Ambulatory Visit: Payer: Self-pay

## 2013-04-21 ENCOUNTER — Other Ambulatory Visit: Payer: Self-pay | Admitting: Internal Medicine

## 2013-06-30 ENCOUNTER — Other Ambulatory Visit: Payer: Self-pay | Admitting: Internal Medicine

## 2013-07-01 ENCOUNTER — Encounter: Payer: Self-pay | Admitting: Internal Medicine

## 2013-07-01 MED ORDER — VALSARTAN 160 MG PO TABS: ORAL_TABLET | ORAL | Status: DC

## 2013-07-01 MED ORDER — AMLODIPINE BESYLATE 5 MG PO TABS: ORAL_TABLET | ORAL | Status: DC

## 2013-07-01 NOTE — Addendum Note (Signed)
Addended by: Townsend Roger D on: 07/01/2013 12:02 PM   Modules accepted: Orders

## 2013-07-23 ENCOUNTER — Ambulatory Visit (INDEPENDENT_AMBULATORY_CARE_PROVIDER_SITE_OTHER): Payer: BC Managed Care – PPO | Admitting: Internal Medicine

## 2013-07-23 ENCOUNTER — Encounter: Payer: Self-pay | Admitting: Internal Medicine

## 2013-07-23 VITALS — BP 122/76 | HR 72 | Temp 98.1°F | Ht 73.0 in | Wt 177.0 lb

## 2013-07-23 DIAGNOSIS — K429 Umbilical hernia without obstruction or gangrene: Secondary | ICD-10-CM

## 2013-07-23 DIAGNOSIS — C6290 Malignant neoplasm of unspecified testis, unspecified whether descended or undescended: Secondary | ICD-10-CM

## 2013-07-23 DIAGNOSIS — I10 Essential (primary) hypertension: Secondary | ICD-10-CM

## 2013-07-23 DIAGNOSIS — K219 Gastro-esophageal reflux disease without esophagitis: Secondary | ICD-10-CM

## 2013-07-23 DIAGNOSIS — E785 Hyperlipidemia, unspecified: Secondary | ICD-10-CM

## 2013-07-23 LAB — BASIC METABOLIC PANEL
BUN: 10 mg/dL (ref 6–23)
CALCIUM: 9.5 mg/dL (ref 8.4–10.5)
CO2: 25 mEq/L (ref 19–32)
Chloride: 102 mEq/L (ref 96–112)
Creatinine, Ser: 0.8 mg/dL (ref 0.4–1.5)
GFR: 117.74 mL/min (ref >60.00)
Glucose, Bld: 91 mg/dL (ref 70–99)
Potassium: 3.8 mEq/L (ref 3.5–5.1)
Sodium: 137 mEq/L (ref 135–145)

## 2013-07-23 MED ORDER — VALSARTAN 160 MG PO TABS: ORAL_TABLET | ORAL | Status: DC

## 2013-07-23 MED ORDER — OMEPRAZOLE 20 MG PO CPDR: 20.0000 mg | DELAYED_RELEASE_CAPSULE | Freq: Every day | ORAL | Status: DC

## 2013-07-23 MED ORDER — AMLODIPINE BESYLATE 5 MG PO TABS: ORAL_TABLET | ORAL | Status: DC

## 2013-07-23 NOTE — Assessment & Plan Note (Signed)
Continue dietary mgt.

## 2013-07-23 NOTE — Assessment & Plan Note (Signed)
Patient's blood pressure is well controlled. Continue current medication regimen. Monitor electrolytes and kidney function. BP: 122/76 mmHg  Lab Results  Component Value Date   CREATININE 0.8 07/23/2013   Lab Results  Component Value Date   NA 137 07/23/2013   K 3.8 07/23/2013   CL 102 07/23/2013   CO2 25 07/23/2013

## 2013-07-23 NOTE — Progress Notes (Signed)
   Subjective:    Patient ID: Jordan Beasley, male    DOB: February 09, 1971, 43 y.o.   MRN: [id number]  HPI  43 year old white male with history of testicular cancer/seminoma and hypertension for routine followup. Interval medical history-patient was seen by his urologist in September of 2014. Surveillance chest x-ray was unremarkable.  Hypertension-patient has been monitoring his blood pressure at home. Systolic blood pressure has ranged from 740-814 systolic.  Patient currently taking amlodipine 2.5 mg once daily and valsartan 160 mg.   Review of Systems Negative for dizziness    Past Medical History  Diagnosis Date  . Hypertension   . Seminoma of testis   . Cancer 05/19/10    seminoma L testicle  . History of radiation therapy 06/16/10 to 07/08/10    paraaortic region    History   Social History  . Marital Status: Married    Spouse Name: N/A    Number of Children: 3  . Years of Education: N/A   Occupational History  . SALES    Social History Main Topics  . Smoking status: Never Smoker   . Smokeless tobacco: Not on file  . Alcohol Use: Yes     Comment: occassional  . Drug Use: No  . Sexual Activity: Not on file   Other Topics Concern  . Not on file   Social History Narrative  . No narrative on file    Past Surgical History  Procedure Laterality Date  . Eye surgery      age 43  . Orchiectomy  05/19/2010    left inguinal orchiectomy    Family History  Problem Relation Age of Onset  . Hypertension Mother   . Stroke Mother   . Hypertension Father   . Cancer Maternal Grandmother     ovarian  . Diabetes Maternal Grandfather   . Stroke Maternal Grandfather   . Colon cancer Neg Hx   . Breast cancer Neg Hx     No Known Allergies  No current outpatient prescriptions on file prior to visit.   No current facility-administered medications on file prior to visit.    BP 122/76  Pulse 72  Temp(Src) 98.1 F (36.7 C) (Oral)  Ht 6\' 1"  (1.854 m)  Wt 177 lb (80.287  kg)  BMI 23.36 kg/m2    Objective:   Physical Exam  Constitutional: He is oriented to person, place, and time. He appears well-developed and well-nourished. No distress.  HENT:  Head: Normocephalic and atraumatic.  Cardiovascular: Normal rate, regular rhythm and normal heart sounds.   No murmur heard. Pulmonary/Chest: Effort normal and breath sounds normal. He has no wheezes.  Neurological: He is alert and oriented to person, place, and time. No cranial nerve deficit.  Psychiatric: He has a normal mood and affect. His behavior is normal.          Assessment & Plan:

## 2013-07-23 NOTE — Patient Instructions (Signed)
Please complete the following lab tests before your next follow up appointment: BMET. FLP - 401.9

## 2013-07-23 NOTE — Progress Notes (Signed)
Pre visit review using our clinic review tool, if applicable. No additional management support is needed unless otherwise documented below in the visit note. 

## 2013-07-23 NOTE — Assessment & Plan Note (Signed)
Patient's last visit with urologist was in September of 2014. Surveillance chest x-ray was unremarkable. Followup CT scan of abdomen and pelvis scheduled in 6 months.

## 2013-07-25 ENCOUNTER — Telehealth: Payer: Self-pay | Admitting: Internal Medicine

## 2013-07-25 NOTE — Telephone Encounter (Signed)
Relevant patient education assigned to patient using Emmi. ° °

## 2013-08-04 ENCOUNTER — Ambulatory Visit (INDEPENDENT_AMBULATORY_CARE_PROVIDER_SITE_OTHER): Payer: BC Managed Care – PPO | Admitting: Surgery

## 2013-08-04 ENCOUNTER — Other Ambulatory Visit: Payer: Self-pay | Admitting: Urology

## 2013-08-04 ENCOUNTER — Ambulatory Visit (HOSPITAL_COMMUNITY)
Admission: RE | Admit: 2013-08-04 | Discharge: 2013-08-04 | Disposition: A | Discharge status: Home / Self Care | Payer: BC Managed Care – PPO | Source: Ambulatory Visit | Attending: Urology | Admitting: Urology

## 2013-08-04 ENCOUNTER — Encounter (INDEPENDENT_AMBULATORY_CARE_PROVIDER_SITE_OTHER): Payer: Self-pay | Admitting: Surgery

## 2013-08-04 VITALS — BP 123/77 | HR 78 | Temp 97.7°F | Resp 18 | Ht 71.0 in | Wt 178.4 lb

## 2013-08-04 DIAGNOSIS — Z8547 Personal history of malignant neoplasm of testis: Secondary | ICD-10-CM | POA: Insufficient documentation

## 2013-08-04 DIAGNOSIS — C6290 Malignant neoplasm of unspecified testis, unspecified whether descended or undescended: Secondary | ICD-10-CM

## 2013-08-04 DIAGNOSIS — K429 Umbilical hernia without obstruction or gangrene: Secondary | ICD-10-CM | POA: Insufficient documentation

## 2013-08-04 DIAGNOSIS — I10 Essential (primary) hypertension: Secondary | ICD-10-CM | POA: Insufficient documentation

## 2013-08-04 NOTE — Progress Notes (Signed)
Patient ID: Jordan Beasley, male   DOB: 1970/06/30, 43 y.o.   MRN: [id number]  Chief Complaint  Patient presents with  . Hernia    HPI Jordan Beasley is a 43 y.o. male.  Referred by Dr. Shawna Orleans for evaluation of umbilical hernia  HPI This is a 43 yo male That presents with several years of a visible palpable umbilical hernia. This has enlarged slightly and is starting to cause some discomfort. He is unable to reduce this. He mentioned this to his physician who has now referred him for surgical evaluation. He denies any obstructive symptoms. He did have radiation to this area for seminoma of the left testicle with radiation to his para-aortic region in 2012.  Past Medical History  Diagnosis Date  . Hypertension   . Seminoma of testis   . Cancer 05/19/10    seminoma L testicle  . History of radiation therapy 06/16/10 to 07/08/10    paraaortic region    Past Surgical History  Procedure Laterality Date  . Eye surgery      age 58  . Orchiectomy  05/19/2010    left inguinal orchiectomy  . Testicular exploration Left 05/19/2010    Family History  Problem Relation Age of Onset  . Hypertension Mother   . Stroke Mother   . Hypertension Father   . Cancer Maternal Grandmother     ovarian  . Diabetes Maternal Grandfather   . Stroke Maternal Grandfather   . Colon cancer Neg Hx   . Breast cancer Neg Hx     Social History History  Substance Use Topics  . Smoking status: Never Smoker   . Smokeless tobacco: Not on file  . Alcohol Use: Yes     Comment: occassional    No Known Allergies  Current Outpatient Prescriptions  Medication Sig Dispense Refill  . amLODipine (NORVASC) 5 MG tablet TAKE 1 TABLET BY MOUTH EVERY DAY  90 tablet  1  . omeprazole (PRILOSEC) 20 MG capsule Take 1 capsule (20 mg total) by mouth daily.  90 capsule  1  . valsartan (DIOVAN) 160 MG tablet TAKE 1 TABLET BY MOUTH DAILY.  90 tablet  1   No current facility-administered medications for this visit.    Review of  Systems Review of Systems  Constitutional: Negative for fever, chills and unexpected weight change.  HENT: Negative for congestion, hearing loss, sore throat, trouble swallowing and voice change.   Eyes: Negative for visual disturbance.  Respiratory: Negative for cough and wheezing.   Cardiovascular: Negative for chest pain, palpitations and leg swelling.  Gastrointestinal: Positive for abdominal pain. Negative for nausea, vomiting, diarrhea, constipation, blood in stool, abdominal distention, anal bleeding and rectal pain.  Genitourinary: Negative for hematuria and difficulty urinating.  Musculoskeletal: Negative for arthralgias.  Skin: Negative for rash and wound.  Neurological: Negative for seizures, syncope, weakness and headaches.  Hematological: Negative for adenopathy. Does not bruise/bleed easily.  Psychiatric/Behavioral: Negative for confusion.    Blood pressure 123/77, pulse 78, temperature 97.7 F (36.5 C), temperature source Temporal, resp. rate 18, height 5\' 11"  (1.803 m), weight 178 lb 6.4 oz (80.922 kg).  Physical Exam Physical Exam WDWN in NAD HEENT:  EOMI, sclera anicteric Neck:  No masses, no thyromegaly Lungs:  CTA bilaterally; normal respiratory effort CV:  Regular rate and rhythm; no murmurs Abd:  +bowel sounds, soft, non-tender, protruding mass in upper umbilicus - soft, non-reducible Ext:  Well-perfused; no edema Skin:  Warm, dry; no sign of jaundice  Data  Reviewed none  Assessment    Umbilical hernia - non-reducible; likely containing preperitoneal fat     Plan    Umbilical hernia repair with mesh. The surgical procedure has been discussed with the patient.  Potential risks, benefits, alternative treatments, and expected outcomes have been explained.  All of the patient's questions at this time have been answered.  The likelihood of reaching the patient's treatment goal is good.  The patient understand the proposed surgical procedure and wishes to  proceed.         Darryn Kydd K. 08/04/2013, 10:51 AM

## 2014-04-07 ENCOUNTER — Other Ambulatory Visit: Payer: Self-pay | Admitting: Internal Medicine

## 2014-07-08 ENCOUNTER — Other Ambulatory Visit: Payer: Self-pay | Admitting: Internal Medicine

## 2014-08-05 ENCOUNTER — Other Ambulatory Visit: Payer: Self-pay | Admitting: Internal Medicine

## 2014-08-06 ENCOUNTER — Ambulatory Visit (HOSPITAL_COMMUNITY)
Admission: RE | Admit: 2014-08-06 | Discharge: 2014-08-06 | Disposition: A | Discharge status: Home / Self Care | Payer: BLUE CROSS/BLUE SHIELD | Source: Ambulatory Visit | Attending: Urology | Admitting: Urology

## 2014-08-06 ENCOUNTER — Other Ambulatory Visit: Payer: Self-pay | Admitting: Urology

## 2014-08-06 DIAGNOSIS — C6290 Malignant neoplasm of unspecified testis, unspecified whether descended or undescended: Secondary | ICD-10-CM

## 2014-08-31 ENCOUNTER — Other Ambulatory Visit: Payer: Self-pay | Admitting: Internal Medicine

## 2014-10-05 ENCOUNTER — Other Ambulatory Visit: Payer: Self-pay | Admitting: Internal Medicine

## 2014-10-13 ENCOUNTER — Encounter: Payer: Self-pay | Admitting: Adult Health

## 2014-10-13 ENCOUNTER — Ambulatory Visit (INDEPENDENT_AMBULATORY_CARE_PROVIDER_SITE_OTHER): Payer: BLUE CROSS/BLUE SHIELD | Admitting: Adult Health

## 2014-10-13 ENCOUNTER — Ambulatory Visit: Payer: BLUE CROSS/BLUE SHIELD | Admitting: Family Medicine

## 2014-10-13 VITALS — BP 140/84 | Temp 98.3°F | Ht 71.0 in | Wt 180.0 lb

## 2014-10-13 DIAGNOSIS — I10 Essential (primary) hypertension: Secondary | ICD-10-CM

## 2014-10-13 DIAGNOSIS — Z09 Encounter for follow-up examination after completed treatment for conditions other than malignant neoplasm: Secondary | ICD-10-CM

## 2014-10-13 LAB — BASIC METABOLIC PANEL
BUN: 10 mg/dL (ref 6–23)
CO2: 30 mEq/L (ref 19–32)
Calcium: 9.4 mg/dL (ref 8.4–10.5)
Chloride: 103 mEq/L (ref 96–112)
Creatinine, Ser: 0.91 mg/dL (ref 0.40–1.50)
GFR: 96.53 mL/min (ref >60.00)
GLUCOSE: 106 mg/dL — AB (ref 70–99)
Potassium: 4.1 mEq/L (ref 3.5–5.1)
SODIUM: 138 meq/L (ref 135–145)

## 2014-10-13 MED ORDER — AMLODIPINE BESYLATE 5 MG PO TABS: 5.0000 mg | ORAL_TABLET | Freq: Every day | ORAL | Status: DC

## 2014-10-13 MED ORDER — VALSARTAN 160 MG PO TABS: 160.0000 mg | ORAL_TABLET | Freq: Every day | ORAL | Status: DC

## 2014-10-13 NOTE — Progress Notes (Signed)
Pre visit review using our clinic review tool, if applicable. No additional management support is needed unless otherwise documented below in the visit note. 

## 2014-10-13 NOTE — Patient Instructions (Addendum)
Continue to monitor your blood pressure at home, exercise and eat a healthy diet. I will let you know what your lab shows and if we need to change anything. Follow up with Dr. Shawna Orleans in six months.It was great meeting you today.    Health Maintenance A healthy lifestyle and preventative care can promote health and wellness.  Maintain regular health, dental, and eye exams.  Eat a healthy diet. Foods like vegetables, fruits, whole grains, low-fat dairy products, and lean protein foods contain the nutrients you need and are low in calories. Decrease your intake of foods high in solid fats, added sugars, and salt. Get information about a proper diet from your health care provider, if necessary.  Regular physical exercise is one of the most important things you can do for your health. Most adults should get at least 150 minutes of moderate-intensity exercise (any activity that increases your heart rate and causes you to sweat) each week. In addition, most adults need muscle-strengthening exercises on 2 or more days a week.   Maintain a healthy weight. The body mass index (BMI) is a screening tool to identify possible weight problems. It provides an estimate of body fat based on height and weight. Your health care provider can find your BMI and can help you achieve or maintain a healthy weight. For males 20 years and older:  A BMI below 18.5 is considered underweight.  A BMI of 18.5 to 24.9 is normal.  A BMI of 25 to 29.9 is considered overweight.  A BMI of 30 and above is considered obese.  Maintain normal blood lipids and cholesterol by exercising and minimizing your intake of saturated fat. Eat a balanced diet with plenty of fruits and vegetables. Blood tests for lipids and cholesterol should begin at age 50 and be repeated every 5 years. If your lipid or cholesterol levels are high, you are over age 78, or you are at high risk for heart disease, you may need your cholesterol levels checked more  frequently.Ongoing high lipid and cholesterol levels should be treated with medicines if diet and exercise are not working.  If you smoke, find out from your health care provider how to quit. If you do not use tobacco, do not start.  Lung cancer screening is recommended for adults aged 24-80 years who are at high risk for developing lung cancer because of a history of smoking. A yearly low-dose CT scan of the lungs is recommended for people who have at least a 30-pack-year history of smoking and are current smokers or have quit within the past 15 years. A pack year of smoking is smoking an average of 1 pack of cigarettes a day for 1 year (for example, a 30-pack-year history of smoking could mean smoking 1 pack a day for 30 years or 2 packs a day for 15 years). Yearly screening should continue until the smoker has stopped smoking for at least 15 years. Yearly screening should be stopped for people who develop a health problem that would prevent them from having lung cancer treatment.  If you choose to drink alcohol, do not have more than 2 drinks per day. One drink is considered to be 12 oz (360 mL) of beer, 5 oz (150 mL) of wine, or 1.5 oz (45 mL) of liquor.  Avoid the use of street drugs. Do not share needles with anyone. Ask for help if you need support or instructions about stopping the use of drugs.  High blood pressure causes heart disease  and increases the risk of stroke. Blood pressure should be checked at least every 1-2 years. Ongoing high blood pressure should be treated with medicines if weight loss and exercise are not effective.  If you are 29-25 years old, ask your health care provider if you should take aspirin to prevent heart disease.  Diabetes screening involves taking a blood sample to check your fasting blood sugar level. This should be done once every 3 years after age 29 if you are at a normal weight and without risk factors for diabetes. Testing should be considered at a younger  age or be carried out more frequently if you are overweight and have at least 1 risk factor for diabetes.  Colorectal cancer can be detected and often prevented. Most routine colorectal cancer screening begins at the age of 1 and continues through age 35. However, your health care provider may recommend screening at an earlier age if you have risk factors for colon cancer. On a yearly basis, your health care provider may provide home test kits to check for hidden blood in the stool. A small camera at the end of a tube may be used to directly examine the colon (sigmoidoscopy or colonoscopy) to detect the earliest forms of colorectal cancer. Talk to your health care provider about this at age 109 when routine screening begins. A direct exam of the colon should be repeated every 5-10 years through age 51, unless early forms of precancerous polyps or small growths are found.  People who are at an increased risk for hepatitis B should be screened for this virus. You are considered at high risk for hepatitis B if:  You were born in a country where hepatitis B occurs often. Talk with your health care provider about which countries are considered high risk.  Your parents were born in a high-risk country and you have not received a shot to protect against hepatitis B (hepatitis B vaccine).  You have HIV or AIDS.  You use needles to inject street drugs.  You live with, or have sex with, someone who has hepatitis B.  You are a man who has sex with other men (MSM).  You get hemodialysis treatment.  You take certain medicines for conditions like cancer, organ transplantation, and autoimmune conditions.  Hepatitis C blood testing is recommended for all people born from [birth year] through 1964 and any individual with known risk factors for hepatitis C.  Healthy men should no longer receive prostate-specific antigen (PSA) blood tests as part of routine cancer screening. Talk to your health care provider about  prostate cancer screening.  Testicular cancer screening is not recommended for adolescents or adult males who have no symptoms. Screening includes self-exam, a health care provider exam, and other screening tests. Consult with your health care provider about any symptoms you have or any concerns you have about testicular cancer.  Practice safe sex. Use condoms and avoid high-risk sexual practices to reduce the spread of sexually transmitted infections (STIs).  You should be screened for STIs, including gonorrhea and chlamydia if:  You are sexually active and are younger than 24 years.  You are older than 24 years, and your health care provider tells you that you are at risk for this type of infection.  Your sexual activity has changed since you were last screened, and you are at an increased risk for chlamydia or gonorrhea. Ask your health care provider if you are at risk.  If you are at risk of being infected with  HIV, it is recommended that you take a prescription medicine daily to prevent HIV infection. This is called pre-exposure prophylaxis (PrEP). You are considered at risk if:  You are a man who has sex with other men (MSM).  You are a heterosexual man who is sexually active with multiple partners.  You take drugs by injection.  You are sexually active with a partner who has HIV.  Talk with your health care provider about whether you are at high risk of being infected with HIV. If you choose to begin PrEP, you should first be tested for HIV. You should then be tested every 3 months for as long as you are taking PrEP.  Use sunscreen. Apply sunscreen liberally and repeatedly throughout the day. You should seek shade when your shadow is shorter than you. Protect yourself by wearing long sleeves, pants, a wide-brimmed hat, and sunglasses year round whenever you are outdoors.  Tell your health care provider of new moles or changes in moles, especially if there is a change in shape or  color. Also, tell your health care provider if a mole is larger than the size of a pencil eraser.  A one-time screening for abdominal aortic aneurysm (AAA) and surgical repair of large AAAs by ultrasound is recommended for men aged 1-75 years who are current or former smokers.  Stay current with your vaccines (immunizations). Document Released: 11/11/2007 Document Revised: 05/20/2013 Document Reviewed: 10/10/2010 [facility] Patient Information 2015 [place], [place]. This information is not intended to replace advice given to you by your health care provider. Make sure you discuss any questions you have with your health care provider.

## 2014-10-13 NOTE — Progress Notes (Addendum)
Subjective:    Patient ID: Jordan Beasley, male    DOB: June 25, 1970, 44 y.o.   MRN: [id number]  HPI  Mr. Hemp is a patient of Dr. Shawna Orleans, who I saw in his absence.He is here for follow up regarding his blood pressure. He endorses having well controlled blood pressure on the current regimen. Has been out of his blood pressure medication for seven days prior to this visit. Has no complaints of headaches or blurred vision.   He has no other medical complaints at this time.      Review of Systems  Respiratory: Negative for shortness of breath.   Cardiovascular: Negative for chest pain, palpitations and leg swelling.  Neurological: Positive for weakness. Negative for dizziness, light-headedness, numbness and headaches.  All other systems reviewed and are negative.  Past Medical History  Diagnosis Date  . Hypertension   . Seminoma of testis   . Cancer 05/19/10    seminoma L testicle  . History of radiation therapy 06/16/10 to 07/08/10    paraaortic region    History   Social History  . Marital Status: Married    Spouse Name: N/A  . Number of Children: 3  . Years of Education: N/A   Occupational History  . SALES    Social History Main Topics  . Smoking status: Never Smoker   . Smokeless tobacco: Not on file  . Alcohol Use: Yes     Comment: occassional  . Drug Use: No  . Sexual Activity: Not on file   Other Topics Concern  . Not on file   Social History Narrative    Past Surgical History  Procedure Laterality Date  . Eye surgery      age 15  . Orchiectomy  05/19/2010    left inguinal orchiectomy  . Testicular exploration Left 05/19/2010    Family History  Problem Relation Age of Onset  . Hypertension Mother   . Stroke Mother   . Hypertension Father   . Cancer Maternal Grandmother     ovarian  . Diabetes Maternal Grandfather   . Stroke Maternal Grandfather   . Colon cancer Neg Hx   . Breast cancer Neg Hx     No Known Allergies  Current Outpatient  Prescriptions on File Prior to Visit  Medication Sig Dispense Refill  . omeprazole (PRILOSEC) 20 MG capsule Take 1 capsule (20 mg total) by mouth daily. 90 capsule 1   No current facility-administered medications on file prior to visit.    BP 140/84 mmHg  Temp(Src) 98.3 F (36.8 C) (Oral)  Ht 5\' 11"  (1.803 m)  Wt 180 lb (81.647 kg)  BMI 25.12 kg/m2       Objective:   Physical Exam  Constitutional: He is oriented to person, place, and time. He appears well-developed and well-nourished.  Cardiovascular: Normal rate and regular rhythm.  Exam reveals no gallop and no friction rub.   No murmur heard. Pulmonary/Chest: Effort normal and breath sounds normal. No respiratory distress. He has no wheezes. He has no rales. He exhibits no tenderness.  Neurological: He is alert and oriented to person, place, and time.  Psychiatric: He has a normal mood and affect. His behavior is normal. Judgment and thought content normal.  Nursing note and vitals reviewed.      Assessment & Plan:   1. Essential hypertension - valsartan (DIOVAN) 160 MG tablet; Take 1 tablet (160 mg total) by mouth daily.  Dispense: 90 tablet; Refill: 1 - amLODipine (NORVASC) 5  MG tablet; Take 1 tablet (5 mg total) by mouth daily.  Dispense: 90 tablet; Refill: 1 - Basic metabolic panel  2. Follow up - valsartan (DIOVAN) 160 MG tablet; Take 1 tablet (160 mg total) by mouth daily.  Dispense: 90 tablet; Refill: 1 - amLODipine (NORVASC) 5 MG tablet; Take 1 tablet (5 mg total) by mouth daily.  Dispense: 90 tablet; Refill: 1 - Basic metabolic panel - Follow up with Dr. Shawna Orleans in 6 months.

## 2014-10-14 ENCOUNTER — Ambulatory Visit: Payer: BLUE CROSS/BLUE SHIELD | Admitting: Internal Medicine

## 2015-05-17 ENCOUNTER — Other Ambulatory Visit: Payer: Self-pay | Admitting: Family Medicine

## 2015-05-17 DIAGNOSIS — I10 Essential (primary) hypertension: Secondary | ICD-10-CM

## 2015-05-17 MED ORDER — VALSARTAN 160 MG PO TABS: 160.0000 mg | ORAL_TABLET | Freq: Every day | ORAL | Status: DC

## 2015-10-19 ENCOUNTER — Other Ambulatory Visit: Payer: Self-pay | Admitting: Adult Health

## 2016-01-25 ENCOUNTER — Other Ambulatory Visit: Payer: Self-pay | Admitting: Internal Medicine

## 2016-01-25 DIAGNOSIS — I10 Essential (primary) hypertension: Secondary | ICD-10-CM

## 2016-03-14 ENCOUNTER — Ambulatory Visit (INDEPENDENT_AMBULATORY_CARE_PROVIDER_SITE_OTHER): Payer: BLUE CROSS/BLUE SHIELD | Admitting: Adult Health

## 2016-03-14 ENCOUNTER — Encounter: Payer: Self-pay | Admitting: Adult Health

## 2016-03-14 VITALS — BP 162/92 | Temp 98.6°F | Ht 71.0 in | Wt 190.4 lb

## 2016-03-14 DIAGNOSIS — Z23 Encounter for immunization: Secondary | ICD-10-CM

## 2016-03-14 DIAGNOSIS — I10 Essential (primary) hypertension: Secondary | ICD-10-CM | POA: Diagnosis not present

## 2016-03-14 DIAGNOSIS — Z0000 Encounter for general adult medical examination without abnormal findings: Secondary | ICD-10-CM

## 2016-03-14 LAB — LIPID PANEL
CHOL/HDL RATIO: 5
Cholesterol: 269 mg/dL — ABNORMAL HIGH (ref 0–200)
HDL: 57 mg/dL (ref >39.00)
LDL CALC: 191 mg/dL — AB (ref 0–99)
NONHDL: 211.75
Triglycerides: 102 mg/dL (ref 0.0–149.0)
VLDL: 20.4 mg/dL (ref 0.0–40.0)

## 2016-03-14 LAB — HEPATIC FUNCTION PANEL
ALK PHOS: 64 U/L (ref 39–117)
ALT: 25 U/L (ref 0–53)
AST: 22 U/L (ref 0–37)
Albumin: 4.7 g/dL (ref 3.5–5.2)
BILIRUBIN DIRECT: 0.1 mg/dL (ref 0.0–0.3)
BILIRUBIN TOTAL: 0.8 mg/dL (ref 0.2–1.2)
Total Protein: 7.7 g/dL (ref 6.0–8.3)

## 2016-03-14 LAB — POC URINALSYSI DIPSTICK (AUTOMATED)
Bilirubin, UA: NEGATIVE
Blood, UA: NEGATIVE
GLUCOSE UA: NEGATIVE
LEUKOCYTES UA: NEGATIVE
Nitrite, UA: NEGATIVE
Spec Grav, UA: 1.015
UROBILINOGEN UA: 0.2
pH, UA: 8

## 2016-03-14 LAB — BASIC METABOLIC PANEL
BUN: 11 mg/dL (ref 6–23)
CHLORIDE: 102 meq/L (ref 96–112)
CO2: 26 meq/L (ref 19–32)
Calcium: 9.8 mg/dL (ref 8.4–10.5)
Creatinine, Ser: 0.89 mg/dL (ref 0.40–1.50)
GFR: 98.39 mL/min (ref >60.00)
Glucose, Bld: 102 mg/dL — ABNORMAL HIGH (ref 70–99)
POTASSIUM: 4.1 meq/L (ref 3.5–5.1)
Sodium: 140 mEq/L (ref 135–145)

## 2016-03-14 LAB — HEMOGLOBIN A1C: HEMOGLOBIN A1C: 5.5 % (ref 4.6–6.5)

## 2016-03-14 LAB — TSH: TSH: 1.98 u[IU]/mL (ref 0.35–4.50)

## 2016-03-14 LAB — PSA: PSA: 1.24 ng/mL (ref 0.10–4.00)

## 2016-03-14 MED ORDER — AMLODIPINE BESYLATE 5 MG PO TABS: 5.0000 mg | ORAL_TABLET | Freq: Every day | ORAL | 3 refills | Status: DC

## 2016-03-14 MED ORDER — VALSARTAN 160 MG PO TABS: 160.0000 mg | ORAL_TABLET | Freq: Every day | ORAL | 3 refills | Status: DC

## 2016-03-14 MED ORDER — OMEPRAZOLE 20 MG PO CPDR: 20.0000 mg | DELAYED_RELEASE_CAPSULE | Freq: Every day | ORAL | 3 refills | Status: DC

## 2016-03-14 NOTE — Patient Instructions (Signed)
It was great seeing you today!  I have sent in a year supply of all your medications.   I will follow up with you regarding your blood work.   Health Maintenance, Male A healthy lifestyle and preventative care can promote health and wellness.  Maintain regular health, dental, and eye exams.  Eat a healthy diet. Foods like vegetables, fruits, whole grains, low-fat dairy products, and lean protein foods contain the nutrients you need and are low in calories. Decrease your intake of foods high in solid fats, added sugars, and salt. Get information about a proper diet from your health care provider, if necessary.  Regular physical exercise is one of the most important things you can do for your health. Most adults should get at least 150 minutes of moderate-intensity exercise (any activity that increases your heart rate and causes you to sweat) each week. In addition, most adults need muscle-strengthening exercises on 2 or more days a week.   Maintain a healthy weight. The body mass index (BMI) is a screening tool to identify possible weight problems. It provides an estimate of body fat based on height and weight. Your health care provider can find your BMI and can help you achieve or maintain a healthy weight. For males 20 years and older:  A BMI below 18.5 is considered underweight.  A BMI of 18.5 to 24.9 is normal.  A BMI of 25 to 29.9 is considered overweight.  A BMI of 30 and above is considered obese.  Maintain normal blood lipids and cholesterol by exercising and minimizing your intake of saturated fat. Eat a balanced diet with plenty of fruits and vegetables. Blood tests for lipids and cholesterol should begin at age 22 and be repeated every 5 years. If your lipid or cholesterol levels are high, you are over age 55, or you are at high risk for heart disease, you may need your cholesterol levels checked more frequently.Ongoing high lipid and cholesterol levels should be treated with  medicines if diet and exercise are not working.  If you smoke, find out from your health care provider how to quit. If you do not use tobacco, do not start.  Lung cancer screening is recommended for adults aged 76-80 years who are at high risk for developing lung cancer because of a history of smoking. A yearly low-dose CT scan of the lungs is recommended for people who have at least a 30-pack-year history of smoking and are current smokers or have quit within the past 15 years. A pack year of smoking is smoking an average of 1 pack of cigarettes a day for 1 year (for example, a 30-pack-year history of smoking could mean smoking 1 pack a day for 30 years or 2 packs a day for 15 years). Yearly screening should continue until the smoker has stopped smoking for at least 15 years. Yearly screening should be stopped for people who develop a health problem that would prevent them from having lung cancer treatment.  If you choose to drink alcohol, do not have more than 2 drinks per day. One drink is considered to be 12 oz (360 mL) of beer, 5 oz (150 mL) of wine, or 1.5 oz (45 mL) of liquor.  Avoid the use of street drugs. Do not share needles with anyone. Ask for help if you need support or instructions about stopping the use of drugs.  High blood pressure causes heart disease and increases the risk of stroke. High blood pressure is more likely to  develop in:  People who have blood pressure in the end of the normal range (100-139/85-89 mm Hg).  People who are overweight or obese.  People who are African American.  If you are 31-28 years of age, have your blood pressure checked every 3-5 years. If you are 81 years of age or older, have your blood pressure checked every year. You should have your blood pressure measured twice--once when you are at a hospital or clinic, and once when you are not at a hospital or clinic. Record the average of the two measurements. To check your blood pressure when you are not  at a hospital or clinic, you can use:  An automated blood pressure machine at a pharmacy.  A home blood pressure monitor.  If you are 69-6 years old, ask your health care provider if you should take aspirin to prevent heart disease.  Diabetes screening involves taking a blood sample to check your fasting blood sugar level. This should be done once every 3 years after age 5 if you are at a normal weight and without risk factors for diabetes. Testing should be considered at a younger age or be carried out more frequently if you are overweight and have at least 1 risk factor for diabetes.  Colorectal cancer can be detected and often prevented. Most routine colorectal cancer screening begins at the age of 106 and continues through age 37. However, your health care provider may recommend screening at an earlier age if you have risk factors for colon cancer. On a yearly basis, your health care provider may provide home test kits to check for hidden blood in the stool. A small camera at the end of a tube may be used to directly examine the colon (sigmoidoscopy or colonoscopy) to detect the earliest forms of colorectal cancer. Talk to your health care provider about this at age 82 when routine screening begins. A direct exam of the colon should be repeated every 5-10 years through age 75, unless early forms of precancerous polyps or small growths are found.  People who are at an increased risk for hepatitis B should be screened for this virus. You are considered at high risk for hepatitis B if:  You were born in a country where hepatitis B occurs often. Talk with your health care provider about which countries are considered high risk.  Your parents were born in a high-risk country and you have not received a shot to protect against hepatitis B (hepatitis B vaccine).  You have HIV or AIDS.  You use needles to inject street drugs.  You live with, or have sex with, someone who has hepatitis B.  You  are a man who has sex with other men (MSM).  You get hemodialysis treatment.  You take certain medicines for conditions like cancer, organ transplantation, and autoimmune conditions.  Hepatitis C blood testing is recommended for all people born from [birth year] through 1964 and any individual with known risk factors for hepatitis C.  Healthy men should no longer receive prostate-specific antigen (PSA) blood tests as part of routine cancer screening. Talk to your health care provider about prostate cancer screening.  Testicular cancer screening is not recommended for adolescents or adult males who have no symptoms. Screening includes self-exam, a health care provider exam, and other screening tests. Consult with your health care provider about any symptoms you have or any concerns you have about testicular cancer.  Practice safe sex. Use condoms and avoid high-risk sexual practices to reduce  the spread of sexually transmitted infections (STIs).  You should be screened for STIs, including gonorrhea and chlamydia if:  You are sexually active and are younger than 24 years.  You are older than 24 years, and your health care provider tells you that you are at risk for this type of infection.  Your sexual activity has changed since you were last screened, and you are at an increased risk for chlamydia or gonorrhea. Ask your health care provider if you are at risk.  If you are at risk of being infected with HIV, it is recommended that you take a prescription medicine daily to prevent HIV infection. This is called pre-exposure prophylaxis (PrEP). You are considered at risk if:  You are a man who has sex with other men (MSM).  You are a heterosexual man who is sexually active with multiple partners.  You take drugs by injection.  You are sexually active with a partner who has HIV.  Talk with your health care provider about whether you are at high risk of being infected with HIV. If you choose to begin  PrEP, you should first be tested for HIV. You should then be tested every 3 months for as long as you are taking PrEP.  Use sunscreen. Apply sunscreen liberally and repeatedly throughout the day. You should seek shade when your shadow is shorter than you. Protect yourself by wearing long sleeves, pants, a wide-brimmed hat, and sunglasses year round whenever you are outdoors.  Tell your health care provider of new moles or changes in moles, especially if there is a change in shape or color. Also, tell your health care provider if a mole is larger than the size of a pencil eraser.  A one-time screening for abdominal aortic aneurysm (AAA) and surgical repair of large AAAs by ultrasound is recommended for men aged 78-75 years who are current or former smokers.  Stay current with your vaccines (immunizations).   This information is not intended to replace advice given to you by your health care provider. Make sure you discuss any questions you have with your health care provider.   Document Released: 11/11/2007 Document Revised: 06/05/2014 Document Reviewed: 10/10/2010 Elsevier Interactive Patient Education [employer].

## 2016-03-14 NOTE — Progress Notes (Signed)
Patient presents to clinic today to establish care. He is a pleasant 45 year old male who  has a past medical history of Cancer ([place]) (05/19/10); History of radiation therapy (06/16/10 to 07/08/10); Hypertension; and Seminoma of testis ([place]).   Acute Concerns: Establish Care   Chronic Issues: Hypertension  - He is well controlled on current regimen. Has not had any medication for the last month. He denies any headaches or blurred vision      Health Maintenance: Dental -- Does not do routine care Vision -- Does not do routine care Immunizations -- UTD  Colonoscopy -- Never had  Diet: He has not been eating healthy  Exercise: He has started running 3 miles a day x 4 days a week. He has been doing this for the last months.    Past Medical History:  Diagnosis Date  . Cancer ([place]) 05/19/10   seminoma L testicle  . History of radiation therapy 06/16/10 to 07/08/10   paraaortic region  . Hypertension   . Seminoma of testis [facility])     Past Surgical History:  Procedure Laterality Date  . EYE SURGERY     age 10  . ORCHIECTOMY  05/19/2010   left inguinal orchiectomy  . TESTICULAR EXPLORATION Left 05/19/2010    No current outpatient prescriptions on file prior to visit.   No current facility-administered medications on file prior to visit.     No Known Allergies  Family History  Problem Relation Age of Onset  . Hypertension Mother   . Stroke Mother   . Hypertension Father   . Cancer Maternal Grandmother     ovarian  . Diabetes Maternal Grandfather   . Stroke Maternal Grandfather   . Colon cancer Neg Hx   . Breast cancer Neg Hx     Social History   Social History  . Marital status: Married    Spouse name: N/A  . Number of children: 3  . Years of education: N/A   Occupational History  . SALES [employer]   Social History Main Topics  . Smoking status: Never Smoker  . Smokeless tobacco: Not on file  . Alcohol use Yes     Comment: occassional  . Drug  use: No  . Sexual activity: Not on file   Other Topics Concern  . Not on file   Social History Narrative  . No narrative on file    Review of Systems  Constitutional: Negative.   HENT: Negative.   Eyes: Negative.   Respiratory: Negative.   Cardiovascular: Negative.   Gastrointestinal: Negative.   Genitourinary: Negative.   Musculoskeletal: Negative.   Skin: Negative.   Neurological: Negative.   Endo/Heme/Allergies: Negative.   Psychiatric/Behavioral: Negative.   All other systems reviewed and are negative.   BP (!) 162/92   Temp 98.6 F (37 C) (Oral)   Ht 5\' 11"  (1.803 m)   Wt 190 lb 6.4 oz (86.4 kg)   BMI 26.56 kg/m   Physical Exam  Constitutional: He is oriented to person, place, and time and well-developed, well-nourished, and in no distress. No distress.  HENT:  Head: Normocephalic and atraumatic.  Right Ear: External ear normal.  Left Ear: External ear normal.  Nose: Nose normal.  Mouth/Throat: Oropharynx is clear and moist. No oropharyngeal exudate.  Eyes: Conjunctivae and EOM are normal. Pupils are equal, round, and reactive to light. Right eye exhibits no discharge. Left eye exhibits no discharge. No scleral icterus.  Neck: Normal range of motion. Neck  supple. No JVD present. No tracheal deviation present. No thyromegaly present.  Cardiovascular: Normal rate, regular rhythm, normal heart sounds and intact distal pulses.  Exam reveals no gallop and no friction rub.   No murmur heard. Pulmonary/Chest: Effort normal and breath sounds normal. No respiratory distress. He has no wheezes. He has no rales. He exhibits no tenderness.  Abdominal: Soft. Bowel sounds are normal. He exhibits no distension. There is no tenderness. There is no rebound and no guarding.  Genitourinary:  Genitourinary Comments: Deferred  Musculoskeletal: Normal range of motion. He exhibits no edema, tenderness or deformity.  Lymphadenopathy:    He has no cervical adenopathy.  Neurological:  He is alert and oriented to person, place, and time. He has normal reflexes. He displays normal reflexes. No cranial nerve deficit. He exhibits normal muscle tone. Gait normal. Coordination normal. GCS score is 15.  Skin: Skin is warm and dry. No rash noted. He is not diaphoretic. No erythema. No pallor.  Psychiatric: Mood, memory, affect and judgment normal.  Nursing note and vitals reviewed.   Assessment/Plan: 1. Routine general medical examination at a health care facility - POCT Urinalysis Dipstick (Automated) - PSA - Basic metabolic panel - Hemoglobin A1c - Hepatic function panel - Lipid panel - TSH - Work on weight loss through diet and exercise - Educated on the importance of a heart healthy diet  - Follow up in one year for CPE or sooner with any acute issues 2. Essential hypertension - valsartan (DIOVAN) 160 MG tablet; Take 1 tablet (160 mg total) by mouth daily.  Dispense: 90 tablet; Refill: 3 - POCT Urinalysis Dipstick (Automated) - PSA - Basic metabolic panel - Hemoglobin A1c - Hepatic function panel - Lipid panel - TSH  3. Need for prophylactic vaccination and inoculation against influenza  - Flu Vaccine QUAD 36+ mos PF IM (Fluarix & Fluzone Quad PF) - Tdap vaccine greater than or equal to 7yo IM  Dorothyann Peng, NP

## 2016-08-23 ENCOUNTER — Encounter: Payer: Self-pay | Admitting: Adult Health

## 2016-08-23 ENCOUNTER — Other Ambulatory Visit: Payer: Self-pay | Admitting: Adult Health

## 2016-08-23 DIAGNOSIS — K429 Umbilical hernia without obstruction or gangrene: Secondary | ICD-10-CM

## 2016-09-08 DIAGNOSIS — K420 Umbilical hernia with obstruction, without gangrene: Secondary | ICD-10-CM | POA: Diagnosis not present

## 2017-02-16 ENCOUNTER — Encounter: Payer: Self-pay | Admitting: Adult Health

## 2017-04-01 ENCOUNTER — Other Ambulatory Visit: Payer: Self-pay | Admitting: Adult Health

## 2018-04-18 ENCOUNTER — Emergency Department (HOSPITAL_COMMUNITY)
Admission: EM | Admit: 2018-04-18 | Discharge: 2018-04-18 | Disposition: A | Discharge status: Home / Self Care | Payer: BLUE CROSS/BLUE SHIELD | Attending: Emergency Medicine | Admitting: Emergency Medicine

## 2018-04-18 ENCOUNTER — Emergency Department (HOSPITAL_COMMUNITY): Payer: BLUE CROSS/BLUE SHIELD

## 2018-04-18 ENCOUNTER — Other Ambulatory Visit: Payer: Self-pay

## 2018-04-18 ENCOUNTER — Encounter (HOSPITAL_COMMUNITY): Payer: Self-pay | Admitting: Emergency Medicine

## 2018-04-18 DIAGNOSIS — I10 Essential (primary) hypertension: Secondary | ICD-10-CM | POA: Diagnosis not present

## 2018-04-18 DIAGNOSIS — R1013 Epigastric pain: Secondary | ICD-10-CM | POA: Insufficient documentation

## 2018-04-18 DIAGNOSIS — R0600 Dyspnea, unspecified: Secondary | ICD-10-CM | POA: Diagnosis not present

## 2018-04-18 DIAGNOSIS — Z79899 Other long term (current) drug therapy: Secondary | ICD-10-CM | POA: Insufficient documentation

## 2018-04-18 DIAGNOSIS — R0602 Shortness of breath: Secondary | ICD-10-CM | POA: Diagnosis not present

## 2018-04-18 LAB — COMPREHENSIVE METABOLIC PANEL
ALBUMIN: 4.1 g/dL (ref 3.5–5.0)
ALK PHOS: 48 U/L (ref 38–126)
ALT: 28 U/L (ref 0–44)
ANION GAP: 9 (ref 5–15)
AST: 30 U/L (ref 15–41)
BILIRUBIN TOTAL: 0.8 mg/dL (ref 0.3–1.2)
BUN: 11 mg/dL (ref 6–20)
CALCIUM: 9.3 mg/dL (ref 8.9–10.3)
CO2: 22 mmol/L (ref 22–32)
CREATININE: 0.97 mg/dL (ref 0.61–1.24)
Chloride: 109 mmol/L (ref 98–111)
GFR calc non Af Amer: >60 mL/min (ref >60)
GLUCOSE: 114 mg/dL — AB (ref 70–99)
Potassium: 3.6 mmol/L (ref 3.5–5.1)
Sodium: 140 mmol/L (ref 135–145)
TOTAL PROTEIN: 7.3 g/dL (ref 6.5–8.1)

## 2018-04-18 LAB — CBC WITH DIFFERENTIAL/PLATELET
Abs Immature Granulocytes: 0.01 10*3/uL (ref 0.00–0.07)
BASOS PCT: 1 %
Basophils Absolute: 0 10*3/uL (ref 0.0–0.1)
EOS ABS: 0.1 10*3/uL (ref 0.0–0.5)
EOS PCT: 2 %
HCT: 49.1 % (ref 39.0–52.0)
Hemoglobin: 16.3 g/dL (ref 13.0–17.0)
IMMATURE GRANULOCYTES: 0 %
Lymphocytes Relative: 25 %
Lymphs Abs: 1.2 10*3/uL (ref 0.7–4.0)
MCH: 29.4 pg (ref 26.0–34.0)
MCHC: 33.2 g/dL (ref 30.0–36.0)
MCV: 88.6 fL (ref 80.0–100.0)
MONO ABS: 0.3 10*3/uL (ref 0.1–1.0)
MONOS PCT: 7 %
NEUTROS PCT: 65 %
Neutro Abs: 3.3 10*3/uL (ref 1.7–7.7)
PLATELETS: 213 10*3/uL (ref 150–400)
RBC: 5.54 MIL/uL (ref 4.22–5.81)
RDW: 11.7 % (ref 11.5–15.5)
WBC: 5 10*3/uL (ref 4.0–10.5)
nRBC: 0 % (ref 0.0–0.2)

## 2018-04-18 LAB — D-DIMER, QUANTITATIVE: D-Dimer, Quant: <0.27 ug/mL-FEU (ref 0.00–0.50)

## 2018-04-18 LAB — I-STAT TROPONIN, ED: Troponin i, poc: 0.01 ng/mL (ref 0.00–0.08)

## 2018-04-18 MED ORDER — VALSARTAN 160 MG PO TABS: 160.0000 mg | ORAL_TABLET | Freq: Every day | ORAL | 0 refills | Status: DC

## 2018-04-18 MED ORDER — AMLODIPINE BESYLATE 5 MG PO TABS: 5.0000 mg | ORAL_TABLET | Freq: Every day | ORAL | 0 refills | Status: DC

## 2018-04-18 MED ORDER — AMLODIPINE BESYLATE 5 MG PO TABS
5.0000 mg | ORAL_TABLET | Freq: Once | ORAL | Status: AC
  Administered 2018-04-18: 5 mg via ORAL
  Filled 2018-04-18: qty 1

## 2018-04-18 MED ORDER — IRBESARTAN 150 MG PO TABS
150.0000 mg | ORAL_TABLET | Freq: Every day | ORAL | Status: DC
  Administered 2018-04-18: 150 mg via ORAL
  Filled 2018-04-18: qty 1

## 2018-04-18 NOTE — Discharge Instructions (Addendum)
Take your medications as prescribed.  Keep your scheduled appointment with your primary care physician in Tuesday, 04/23/2018

## 2018-04-18 NOTE — ED Notes (Signed)
Discharge instructions discussed with Pt by Audry Riles RN. Pt verbalized understanding. Pt stable and ambulatory.

## 2018-04-18 NOTE — ED Triage Notes (Signed)
Pt c/o SOB x 3 days and acid reflux x 1 week.

## 2018-04-18 NOTE — ED Provider Notes (Addendum)
[facility] EMERGENCY DEPARTMENT Provider Note   CSN: [id number] Arrival date & time: 04/18/18  4854     History   Chief Complaint Chief Complaint  Patient presents with  . Shortness of Breath  . Gastroesophageal Reflux    HPI Jordan Beasley is a 47 y.o. male.  He reports he is been short of breath for approximate the past week and he feels that for the past 3 days his GE reflux has been bothering him manifested by increased burping and vague epigastric discomfort.  Symptoms are nonexertional.  He denies nausea sweatiness.  He is treated himself with omeprazole with partial relief.  No other associated symptoms.  Nothing makes symptoms better or worse.  HPI  Past Medical History:  Diagnosis Date  . Cancer ([place]) 05/19/10   seminoma L testicle  . History of radiation therapy 06/16/10 to 07/08/10   paraaortic region  . Hypertension   . Seminoma of testis [facility])     Patient Active Problem List   Diagnosis Date Noted  . Umbilical hernia 62/70/3500  . History of radiation therapy   . Preventative health care 07/26/2011  . HYPERLIPIDEMIA 07/15/2010  . Cancer ([place]) 05/19/2010  . HYPERTENSION 05/12/2010  . SEMINOMA 05/12/2010    Past Surgical History:  Procedure Laterality Date  . EYE SURGERY     age 34  . ORCHIECTOMY  05/19/2010   left inguinal orchiectomy  . TESTICULAR EXPLORATION Left 05/19/2010        Home Medications    Prior to Admission medications   Medication Sig Start Date End Date Taking? Authorizing Provider  amLODipine (NORVASC) 5 MG tablet TAKE 1 TABLET(5 MG) BY MOUTH DAILY 04/02/17   Nafziger, Tommi Rumps, NP  omeprazole (PRILOSEC) 20 MG capsule TAKE 1 CAPSULE(20 MG) BY MOUTH DAILY 04/02/17   Nafziger, Tommi Rumps, NP  valsartan (DIOVAN) 160 MG tablet Take 1 tablet (160 mg total) by mouth daily. 03/14/16   Nafziger, Tommi Rumps, NP   He has been noncompliant with amlodipine and valsartan for 6 months Family History Family History  Problem Relation Age of  Onset  . Hypertension Mother   . Hypertension Father   . Diabetes Mellitus II Father   . Ovarian cancer Maternal Grandmother        ovarian  . Diabetes Maternal Grandfather   . Stroke Maternal Grandfather   . Colon cancer Neg Hx   . Breast cancer Neg Hx   Father had coronary stents placed age 33  Social History Social History   Tobacco Use  . Smoking status: Never Smoker  . Smokeless tobacco: Never Used  Substance Use Topics  . Alcohol use: Yes    Comment: occassional  . Drug use: No     Allergies   Patient has no known allergies.   Review of Systems Review of Systems  Constitutional: Negative.   HENT: Negative.   Respiratory: Positive for shortness of breath.   Cardiovascular: Negative.   Gastrointestinal: Positive for abdominal pain.       Epigastric discomfort.  Burping.  Musculoskeletal: Negative.   Skin: Negative.   Neurological: Negative.   Psychiatric/Behavioral: Negative.   All other systems reviewed and are negative.    Physical Exam Updated Vital Signs BP (!) 171/112 (BP Location: Right Arm)   Pulse 85   Temp 98.1 F (36.7 C) (Oral)   Resp 14   Ht 5\' 11"  (1.803 m)   Wt 86.2 kg   SpO2 100%   BMI 26.50 kg/m   Physical  Exam  Constitutional: He appears well-developed and well-nourished.  HENT:  Head: Normocephalic and atraumatic.  Eyes: Pupils are equal, round, and reactive to light. Conjunctivae are normal.  Neck: Neck supple. No tracheal deviation present. No thyromegaly present.  Cardiovascular: Normal rate, regular rhythm and intact distal pulses.  No murmur heard. Pulmonary/Chest: Effort normal and breath sounds normal.  Abdominal: Soft. Bowel sounds are normal. He exhibits no distension. There is no tenderness.  Musculoskeletal: Normal range of motion. He exhibits no edema or tenderness.  Neurological: He is alert. Coordination normal.  Skin: Skin is warm and dry. No rash noted.  Psychiatric: He has a normal mood and affect.  Nursing  note and vitals reviewed.    ED Treatments / Results  Labs (all labs ordered are listed, but only abnormal results are displayed) Labs Reviewed  D-DIMER, QUANTITATIVE (NOT AT [facility])  COMPREHENSIVE METABOLIC PANEL  CBC WITH DIFFERENTIAL/PLATELET  I-STAT TROPONIN, ED    EKG EKG Interpretation  Date/Time:  Thursday April 18 2018 08:30:07 EST Ventricular Rate:  88 PR Interval:    QRS Duration: 95 QT Interval:  375 QTC Calculation: 329 R Axis:   62 Text Interpretation:  Sinus rhythm Baseline wander in lead(s) V1 No significant change since last tracing Confirmed by Orlie Dakin [id number]) on 04/18/2018 8:46:34 AM   Radiology No results found.  Procedures Procedures (including critical care time)  Medications Ordered in ED Medications - No data to display  Results for orders placed or performed during the hospital encounter of 04/18/18  D-dimer, quantitative (not at [facility])  Result Value Ref Range   D-Dimer, Quant <0.27 0.00 - 0.50 ug/mL-FEU  Comprehensive metabolic panel  Result Value Ref Range   Sodium 140 135 - 145 mmol/L   Potassium 3.6 3.5 - 5.1 mmol/L   Chloride 109 98 - 111 mmol/L   CO2 22 22 - 32 mmol/L   Glucose, Bld 114 (H) 70 - 99 mg/dL   BUN 11 6 - 20 mg/dL   Creatinine, Ser 0.97 0.61 - 1.24 mg/dL   Calcium 9.3 8.9 - 10.3 mg/dL   Total Protein 7.3 6.5 - 8.1 g/dL   Albumin 4.1 3.5 - 5.0 g/dL   AST 30 15 - 41 U/L   ALT 28 0 - 44 U/L   Alkaline Phosphatase 48 38 - 126 U/L   Total Bilirubin 0.8 0.3 - 1.2 mg/dL   GFR calc non Af Amer >60 >60 mL/min   GFR calc Af Amer >60 >60 mL/min   Anion gap 9 5 - 15  CBC with Differential/Platelet  Result Value Ref Range   WBC 5.0 4.0 - 10.5 K/uL   RBC 5.54 4.22 - 5.81 MIL/uL   Hemoglobin 16.3 13.0 - 17.0 g/dL   HCT 49.1 39.0 - 52.0 %   MCV 88.6 80.0 - 100.0 fL   MCH 29.4 26.0 - 34.0 pg   MCHC 33.2 30.0 - 36.0 g/dL   RDW 11.7 11.5 - 15.5 %   Platelets 213 150 - 400 K/uL   nRBC 0.0 0.0 - 0.2 %   Neutrophils  Relative % 65 %   Neutro Abs 3.3 1.7 - 7.7 K/uL   Lymphocytes Relative 25 %   Lymphs Abs 1.2 0.7 - 4.0 K/uL   Monocytes Relative 7 %   Monocytes Absolute 0.3 0.1 - 1.0 K/uL   Eosinophils Relative 2 %   Eosinophils Absolute 0.1 0.0 - 0.5 K/uL   Basophils Relative 1 %   Basophils Absolute 0.0 0.0 - 0.1  K/uL   Immature Granulocytes 0 %   Abs Immature Granulocytes 0.01 0.00 - 0.07 K/uL  I-stat troponin, ED  Result Value Ref Range   Troponin i, poc 0.01 0.00 - 0.08 ng/mL   Comment 3           Dg Chest 2 View  Result Date: 04/18/2018 CLINICAL DATA:  Shortness of Breath EXAM: CHEST - 2 VIEW COMPARISON:  August 06, 2014 FINDINGS: Lungs are clear. The heart size and pulmonary vascularity are normal. No adenopathy. No pneumothorax. No bone lesions. IMPRESSION: No edema or consolidation. Electronically Signed   By: Lowella Grip III M.D.   On: 04/18/2018 09:30  chest xray  viewed by me Initial Impression / Assessment and Plan / ED Course  I have reviewed the triage vital signs and the nursing notes.  Pertinent labs & imaging results that were available during my care of the patient were reviewed by me and considered in my medical decision making (see chart for details).     10:35 AM feels improved after treatment with Avapro and Norvasc. Pretest clinical suspicion for acute cardiac syndrome is low.  Heart score equals 2.  Pretest clinical suspicion for pulmonary embolism is low.  Negative d-dimer  Chest x-ray viewed by me Plan prescriptions Diovan, Norvasc.  Keep scheduled appointment with PMD in 5 days Final Clinical Impressions(s) / ED Diagnoses  Diagnoses #1 dyspnea Final diagnoses:  None  #2 epigastric pain #3 elevated blood pressure #4 medication noncompliance  ED Discharge Orders    None       Orlie Dakin, MD 04/18/18 1047    Orlie Dakin, MD 04/18/18 1051

## 2018-04-18 NOTE — ED Notes (Signed)
Patient transported to X-ray 

## 2018-04-23 ENCOUNTER — Other Ambulatory Visit: Payer: Self-pay | Admitting: Adult Health

## 2018-04-23 ENCOUNTER — Encounter: Payer: Self-pay | Admitting: Adult Health

## 2018-04-23 ENCOUNTER — Ambulatory Visit (INDEPENDENT_AMBULATORY_CARE_PROVIDER_SITE_OTHER): Payer: BLUE CROSS/BLUE SHIELD | Admitting: Adult Health

## 2018-04-23 VITALS — BP 140/88 | Temp 97.8°F | Wt 185.0 lb

## 2018-04-23 DIAGNOSIS — E782 Mixed hyperlipidemia: Secondary | ICD-10-CM | POA: Diagnosis not present

## 2018-04-23 DIAGNOSIS — Z125 Encounter for screening for malignant neoplasm of prostate: Secondary | ICD-10-CM

## 2018-04-23 DIAGNOSIS — Z0000 Encounter for general adult medical examination without abnormal findings: Secondary | ICD-10-CM | POA: Diagnosis not present

## 2018-04-23 DIAGNOSIS — Z23 Encounter for immunization: Secondary | ICD-10-CM | POA: Diagnosis not present

## 2018-04-23 DIAGNOSIS — I10 Essential (primary) hypertension: Secondary | ICD-10-CM | POA: Diagnosis not present

## 2018-04-23 LAB — LIPID PANEL
CHOL/HDL RATIO: 4
CHOLESTEROL: 209 mg/dL — AB (ref 0–200)
HDL: 49 mg/dL (ref >39.00)
LDL Cholesterol: 134 mg/dL — ABNORMAL HIGH (ref 0–99)
NonHDL: 159.93
TRIGLYCERIDES: 132 mg/dL (ref 0.0–149.0)
VLDL: 26.4 mg/dL (ref 0.0–40.0)

## 2018-04-23 LAB — TSH: TSH: 1.81 u[IU]/mL (ref 0.35–4.50)

## 2018-04-23 LAB — HEMOGLOBIN A1C: Hgb A1c MFr Bld: 5.7 % (ref 4.6–6.5)

## 2018-04-23 LAB — PSA: PSA: 0.98 ng/mL (ref 0.10–4.00)

## 2018-04-23 MED ORDER — SIMVASTATIN 5 MG PO TABS: 5.0000 mg | ORAL_TABLET | Freq: Every day | ORAL | 3 refills | Status: DC

## 2018-04-23 NOTE — Progress Notes (Signed)
Subjective:    Patient ID: Jordan Beasley, male    DOB: 1970/06/15, 47 y.o.   MRN: [id number]  HPI  47 year old male who  has a past medical history of Cancer ([place]) (05/19/10), History of radiation therapy (06/16/10 to 07/08/10), Hypertension, and Seminoma of testis ([place]).  He presents to the office today for follow up after recent ER visit for hypertension. He was last seen in the office over two years ago and at that time her was prescribed Diovan 160 mg. He was without his medication for roughly 8 months. In the ER he was restarted on Diovan 160 mg and Norvasc 5 mg. He has been monitoring his blood pressure at home and reports blood pressure readings in the 130/80's. He denies any chest pain or shortness of breath.   CBC/CMP/d-dimer and chest xray were normal.     Review of Systems  Constitutional: Negative.   HENT: Negative.   Eyes: Negative.   Respiratory: Negative.   Cardiovascular: Negative.   Gastrointestinal: Negative.   Endocrine: Negative.   Genitourinary: Negative.   Musculoskeletal: Negative.   Skin: Negative.   Allergic/Immunologic: Negative.   Neurological: Negative.   Hematological: Negative.   Psychiatric/Behavioral: Negative.   All other systems reviewed and are negative.  Past Medical History:  Diagnosis Date  . Cancer ([place]) 05/19/10   seminoma L testicle  . History of radiation therapy 06/16/10 to 07/08/10   paraaortic region  . Hypertension   . Seminoma of testis [facility])     Social History   Socioeconomic History  . Marital status: Married    Spouse name: Not on file  . Number of children: 3  . Years of education: Not on file  . Highest education level: Not on file  Occupational History  . Occupation: Scientist, clinical (histocompatibility and immunogenetics): [employer]  . Financial resource strain: Not on file  . Food insecurity:    Worry: Not on file    Inability: Not on file  . Transportation needs:    Medical: Not on file    Non-medical: Not on file  Tobacco  Use  . Smoking status: Never Smoker  . Smokeless tobacco: Never Used  Substance and Sexual Activity  . Alcohol use: Yes    Comment: occassional  . Drug use: No  . Sexual activity: Not on file  Lifestyle  . Physical activity:    Days per week: Not on file    Minutes per session: Not on file  . Stress: Not on file  Relationships  . Social connections:    Talks on phone: Not on file    Gets together: Not on file    Attends religious service: Not on file    Active member of club or organization: Not on file    Attends meetings of clubs or organizations: Not on file    Relationship status: Not on file  . Intimate partner violence:    Fear of current or ex partner: Not on file    Emotionally abused: Not on file    Physically abused: Not on file    Forced sexual activity: Not on file  Other Topics Concern  . Not on file  Social History Narrative   Married   Works in [employer]      He likes to go to [employer] and do dirt bikes with his son.     Past Surgical History:  Procedure Laterality Date  . EYE SURGERY  age 43  . ORCHIECTOMY  05/19/2010   left inguinal orchiectomy  . TESTICULAR EXPLORATION Left 05/19/2010    Family History  Problem Relation Age of Onset  . Hypertension Mother   . Hypertension Father   . Diabetes Mellitus II Father   . Ovarian cancer Maternal Grandmother        ovarian  . Diabetes Maternal Grandfather   . Stroke Maternal Grandfather   . Colon cancer Neg Hx   . Breast cancer Neg Hx     No Known Allergies  Current Outpatient Medications on File Prior to Visit  Medication Sig Dispense Refill  . amLODipine (NORVASC) 5 MG tablet Take 1 tablet (5 mg total) by mouth daily. 30 tablet 0  . aspirin EC 81 MG tablet Take 81 mg by mouth daily.    Marland Kitchen omeprazole (PRILOSEC) 20 MG capsule TAKE 1 CAPSULE(20 MG) BY MOUTH DAILY 90 capsule 0  . valsartan (DIOVAN) 160 MG tablet Take 1 tablet (160 mg total) by mouth daily. 30 tablet 0   No current  facility-administered medications on file prior to visit.     BP 140/88   Temp 97.8 F (36.6 C)   Wt 185 lb (83.9 kg)   BMI 25.80 kg/m       Objective:   Physical Exam  Constitutional: He is oriented to person, place, and time. He appears well-developed and well-nourished. No distress.  HENT:  Head: Normocephalic and atraumatic.  Right Ear: External ear normal.  Left Ear: External ear normal.  Nose: Nose normal.  Mouth/Throat: Oropharynx is clear and moist. No oropharyngeal exudate.  Eyes: Pupils are equal, round, and reactive to light. Conjunctivae and EOM are normal. Right eye exhibits no discharge. Left eye exhibits no discharge. No scleral icterus.  Neck: Normal range of motion. Neck supple. No JVD present. No tracheal deviation present. No thyromegaly present.  Cardiovascular: Normal rate, regular rhythm, normal heart sounds and intact distal pulses. Exam reveals no gallop and no friction rub.  No murmur heard. Pulmonary/Chest: Effort normal and breath sounds normal. No stridor. No respiratory distress. He has no wheezes. He has no rales. He exhibits no tenderness.  Abdominal: Soft. Bowel sounds are normal. He exhibits no distension and no mass. There is no tenderness. There is no rebound and no guarding. No hernia.  Umbilical hernia noted, easily reduced  Genitourinary:  Genitourinary Comments: Will do PSA  Musculoskeletal: Normal range of motion. He exhibits no edema, tenderness or deformity.  Lymphadenopathy:    He has no cervical adenopathy.  Neurological: He is alert and oriented to person, place, and time. He displays normal reflexes. No cranial nerve deficit or sensory deficit. He exhibits normal muscle tone. Coordination normal.  Skin: Skin is warm and dry. Capillary refill takes less than 2 seconds. No rash noted. He is not diaphoretic. No erythema. No pallor.  Psychiatric: He has a normal mood and affect. His behavior is normal. Judgment and thought content normal.    Nursing note and vitals reviewed.     Assessment & Plan:  1. Routine general medical examination at a health care facility - One year follow up  - Hemoglobin A1c - Lipid panel - TSH  2. Essential hypertension - Better controlled. Will have him continue to monitor and follow up in two weeks  - Hemoglobin A1c - Lipid panel - TSH  3. Mixed hyperlipidemia - Consider statin  - Hemoglobin A1c - Lipid panel - TSH  4. Prostate cancer screening  - PSA  5.  Need for influenza vaccination  - Flu vaccine > 3yo with preservative IM (Fluvirin Influenza Split)  Dorothyann Peng, NP

## 2018-05-09 ENCOUNTER — Ambulatory Visit: Payer: BLUE CROSS/BLUE SHIELD | Admitting: Adult Health

## 2018-05-09 ENCOUNTER — Encounter: Payer: Self-pay | Admitting: Adult Health

## 2018-05-09 VITALS — BP 118/82 | Temp 98.2°F | Wt 185.0 lb

## 2018-05-09 DIAGNOSIS — I10 Essential (primary) hypertension: Secondary | ICD-10-CM | POA: Diagnosis not present

## 2018-05-09 MED ORDER — AMLODIPINE BESYLATE 5 MG PO TABS: 5.0000 mg | ORAL_TABLET | Freq: Every day | ORAL | 3 refills | Status: DC

## 2018-05-09 MED ORDER — OMEPRAZOLE 20 MG PO CPDR: 20.0000 mg | DELAYED_RELEASE_CAPSULE | Freq: Every day | ORAL | 3 refills | Status: DC

## 2018-05-09 MED ORDER — VALSARTAN 160 MG PO TABS: 160.0000 mg | ORAL_TABLET | Freq: Every day | ORAL | 3 refills | Status: DC

## 2018-05-09 NOTE — Progress Notes (Signed)
Subjective:    Patient ID: Jordan Beasley, male    DOB: 04-May-1971, 47 y.o.   MRN: [id number]  HPI   47 year old male who  has a past medical history of Cancer ([place]) (05/19/10), History of radiation therapy (06/16/10 to 07/08/10), Hypertension, and Seminoma of testis ([place]).  He presents to the office today for BP follow up. Currently maintained on Norvasc 5 mg and Diovan 160 mg. He has been monitoring his BP at home and has readings consistently between 110-120/80. Denies dizziness or lightheadedness with daily activities.   Has not noticed any side effects   Review of Systems See HPI   Past Medical History:  Diagnosis Date  . Cancer ([place]) 05/19/10   seminoma L testicle  . History of radiation therapy 06/16/10 to 07/08/10   paraaortic region  . Hypertension   . Seminoma of testis [facility])     Social History   Socioeconomic History  . Marital status: Married    Spouse name: Not on file  . Number of children: 3  . Years of education: Not on file  . Highest education level: Not on file  Occupational History  . Occupation: Scientist, clinical (histocompatibility and immunogenetics): [employer]  . Financial resource strain: Not on file  . Food insecurity:    Worry: Not on file    Inability: Not on file  . Transportation needs:    Medical: Not on file    Non-medical: Not on file  Tobacco Use  . Smoking status: Never Smoker  . Smokeless tobacco: Never Used  Substance and Sexual Activity  . Alcohol use: Yes    Comment: occassional  . Drug use: No  . Sexual activity: Not on file  Lifestyle  . Physical activity:    Days per week: Not on file    Minutes per session: Not on file  . Stress: Not on file  Relationships  . Social connections:    Talks on phone: Not on file    Gets together: Not on file    Attends religious service: Not on file    Active member of club or organization: Not on file    Attends meetings of clubs or organizations: Not on file    Relationship status: Not on file  .  Intimate partner violence:    Fear of current or ex partner: Not on file    Emotionally abused: Not on file    Physically abused: Not on file    Forced sexual activity: Not on file  Other Topics Concern  . Not on file  Social History Narrative   Married   Works in Press photographer      He likes to go to [employer] and do dirt bikes with his son.     Past Surgical History:  Procedure Laterality Date  . EYE SURGERY     age 41  . ORCHIECTOMY  05/19/2010   left inguinal orchiectomy  . TESTICULAR EXPLORATION Left 05/19/2010    Family History  Problem Relation Age of Onset  . Hypertension Mother   . Hypertension Father   . Diabetes Mellitus II Father   . Ovarian cancer Maternal Grandmother        ovarian  . Diabetes Maternal Grandfather   . Stroke Maternal Grandfather   . Colon cancer Neg Hx   . Breast cancer Neg Hx     No Known Allergies  Current Outpatient Medications on File Prior to Visit  Medication Sig  Dispense Refill  . aspirin EC 81 MG tablet Take 81 mg by mouth daily.    . simvastatin (ZOCOR) 5 MG tablet Take 1 tablet (5 mg total) by mouth at bedtime. 90 tablet 3   No current facility-administered medications on file prior to visit.     BP 118/82   Temp 98.2 F (36.8 C)   Wt 185 lb (83.9 kg)   BMI 25.80 kg/m       Objective:   Physical Exam Vitals signs and nursing note reviewed.  Constitutional:      Appearance: Normal appearance.  Cardiovascular:     Rate and Rhythm: Regular rhythm.     Pulses: Normal pulses.     Heart sounds: Normal heart sounds.  Pulmonary:     Effort: Pulmonary effort is normal.     Breath sounds: Normal breath sounds.  Skin:    Capillary Refill: Capillary refill takes less than 2 seconds.  Neurological:     General: No focal deficit present.     Mental Status: He is alert and oriented to person, place, and time.  Psychiatric:        Mood and Affect: Mood normal.        Behavior: Behavior normal.        Thought Content:  Thought content normal.        Judgment: Judgment normal.       Assessment & Plan:  1. Essential hypertension - no change in medication  - Follow up as needed - amLODipine (NORVASC) 5 MG tablet; Take 1 tablet (5 mg total) by mouth daily.  Dispense: 90 tablet; Refill: 3 - valsartan (DIOVAN) 160 MG tablet; Take 1 tablet (160 mg total) by mouth daily.  Dispense: 90 tablet; Refill: 3  Dorothyann Peng, NP

## 2019-04-03 ENCOUNTER — Encounter: Payer: Self-pay | Admitting: Adult Health

## 2019-05-01 ENCOUNTER — Other Ambulatory Visit: Payer: Self-pay | Admitting: Family Medicine

## 2019-05-01 DIAGNOSIS — I10 Essential (primary) hypertension: Secondary | ICD-10-CM

## 2019-05-01 MED ORDER — VALSARTAN 160 MG PO TABS: 160.0000 mg | ORAL_TABLET | Freq: Every day | ORAL | 0 refills | Status: DC

## 2019-05-01 NOTE — Telephone Encounter (Signed)
Sent to the pharmacy by e-scribe. 

## 2019-05-03 ENCOUNTER — Other Ambulatory Visit: Payer: Self-pay | Admitting: Adult Health

## 2019-05-06 NOTE — Telephone Encounter (Signed)
Sent to the pharmacy by e-scribe for 90 days.  Has upcoming cpx.

## 2019-05-07 ENCOUNTER — Encounter: Payer: Self-pay | Admitting: Adult Health

## 2019-05-07 MED ORDER — SIMVASTATIN 5 MG PO TABS: 5.0000 mg | ORAL_TABLET | Freq: Every day | ORAL | 0 refills | Status: DC

## 2019-05-11 ENCOUNTER — Other Ambulatory Visit: Payer: Self-pay | Admitting: Adult Health

## 2019-05-11 DIAGNOSIS — I10 Essential (primary) hypertension: Secondary | ICD-10-CM

## 2019-05-13 NOTE — Telephone Encounter (Signed)
Sent to the pharmacy by e-scribe for 90 days.  Pt has upcoming cpx. 

## 2019-06-25 ENCOUNTER — Encounter: Payer: Self-pay | Admitting: Adult Health

## 2019-07-01 ENCOUNTER — Other Ambulatory Visit: Payer: Self-pay

## 2019-07-02 ENCOUNTER — Telehealth: Payer: Self-pay | Admitting: Adult Health

## 2019-07-02 ENCOUNTER — Encounter: Payer: Self-pay | Admitting: Adult Health

## 2019-07-02 ENCOUNTER — Ambulatory Visit (INDEPENDENT_AMBULATORY_CARE_PROVIDER_SITE_OTHER): Payer: Commercial Managed Care - PPO | Admitting: Adult Health

## 2019-07-02 VITALS — BP 140/80 | HR 60 | Temp 98.0°F | Ht 71.0 in | Wt 190.0 lb

## 2019-07-02 DIAGNOSIS — E782 Mixed hyperlipidemia: Secondary | ICD-10-CM

## 2019-07-02 DIAGNOSIS — I10 Essential (primary) hypertension: Secondary | ICD-10-CM

## 2019-07-02 DIAGNOSIS — Z0000 Encounter for general adult medical examination without abnormal findings: Secondary | ICD-10-CM

## 2019-07-02 DIAGNOSIS — K429 Umbilical hernia without obstruction or gangrene: Secondary | ICD-10-CM

## 2019-07-02 LAB — COMPREHENSIVE METABOLIC PANEL
ALT: 29 U/L (ref 0–53)
AST: 25 U/L (ref 0–37)
Albumin: 4.6 g/dL (ref 3.5–5.2)
Alkaline Phosphatase: 58 U/L (ref 39–117)
BUN: 11 mg/dL (ref 6–23)
CO2: 27 mEq/L (ref 19–32)
Calcium: 9.3 mg/dL (ref 8.4–10.5)
Chloride: 104 mEq/L (ref 96–112)
Creatinine, Ser: 0.85 mg/dL (ref 0.40–1.50)
GFR: 96.22 mL/min (ref >60.00)
Glucose, Bld: 90 mg/dL (ref 70–99)
Potassium: 3.9 mEq/L (ref 3.5–5.1)
Sodium: 141 mEq/L (ref 135–145)
Total Bilirubin: 0.7 mg/dL (ref 0.2–1.2)
Total Protein: 7.4 g/dL (ref 6.0–8.3)

## 2019-07-02 LAB — CBC WITH DIFFERENTIAL/PLATELET
Basophils Absolute: 0.1 10*3/uL (ref 0.0–0.1)
Basophils Relative: 1 % (ref 0.0–3.0)
Eosinophils Absolute: 0.1 10*3/uL (ref 0.0–0.7)
Eosinophils Relative: 1 % (ref 0.0–5.0)
HCT: 46.4 % (ref 39.0–52.0)
Hemoglobin: 15.8 g/dL (ref 13.0–17.0)
Lymphocytes Relative: 24.5 % (ref 12.0–46.0)
Lymphs Abs: 1.5 10*3/uL (ref 0.7–4.0)
MCHC: 34.1 g/dL (ref 30.0–36.0)
MCV: 88.4 fl (ref 78.0–100.0)
Monocytes Absolute: 0.4 10*3/uL (ref 0.1–1.0)
Monocytes Relative: 7.1 % (ref 3.0–12.0)
Neutro Abs: 4.2 10*3/uL (ref 1.4–7.7)
Neutrophils Relative %: 66.4 % (ref 43.0–77.0)
Platelets: 200 10*3/uL (ref 150.0–400.0)
RBC: 5.24 Mil/uL (ref 4.22–5.81)
RDW: 13.1 % (ref 11.5–15.5)
WBC: 6.2 10*3/uL (ref 4.0–10.5)

## 2019-07-02 LAB — LIPID PANEL
Cholesterol: 207 mg/dL — ABNORMAL HIGH (ref 0–200)
HDL: 50.9 mg/dL (ref >39.00)
LDL Cholesterol: 140 mg/dL — ABNORMAL HIGH (ref 0–99)
NonHDL: 155.84
Total CHOL/HDL Ratio: 4
Triglycerides: 81 mg/dL (ref 0.0–149.0)
VLDL: 16.2 mg/dL (ref 0.0–40.0)

## 2019-07-02 LAB — TSH: TSH: 1.54 u[IU]/mL (ref 0.35–4.50)

## 2019-07-02 NOTE — Telephone Encounter (Signed)
Updated patient on his labs.  Cholesterol panel continues to be elevated.  We will have him increase simvastatin to 10 mg, he will send a MyChart message in the next 2 weeks to let me know if he is tolerating this dose.

## 2019-07-02 NOTE — Progress Notes (Signed)
Subjective:    Patient ID: Jordan Beasley, male    DOB: 10/23/1970, 49 y.o.   MRN: [id number]  HPI Patient presents for yearly preventative medicine examination. He is pleasant 49 year old male who  has a past medical history of Cancer ([place]) (05/19/10), History of radiation therapy (06/16/10 to 07/08/10), Hypertension, and Seminoma of testis ([place]).  Hypertension-currently prescribed Norvasc 5 mg and Diovan 160 mg daily.  He denies dizziness, lightheadedness, chest pain, or shortness of breath.  BP Readings from Last 3 Encounters:  07/02/19 140/80  05/09/18 118/82  04/23/18 140/88   Hyperlipidemia -he was started on simvastatin 5 mg last year due to slightly elevated LDL and total cholesterol.  He denies myalgia or fatigue Lab Results  Component Value Date   CHOL 209 (H) 04/23/2018   HDL 49.00 04/23/2018   LDLCALC 134 (H) 04/23/2018   LDLDIRECT 150.9 07/19/2011   TRIG 132.0 04/23/2018   CHOLHDL 4 04/23/2018    GERD - Controlled with prilosec    Umbilical Hernia - has been present for many years. Feels as though over the last year it has grown in size. Reports pain when he sleeps on his stomach and with reduction. He denies issues with bowel or bladder and has not noticed any bruising around the hernia. He would like to be referred back to General Surgery.   All immunizations and health maintenance protocols were reviewed with the patient and needed orders were placed.  Appropriate screening laboratory values were ordered for the patient including screening of hyperlipidemia, renal function and hepatic function. If indicated by BPH, a PSA was ordered.  Medication reconciliation,  past medical history, social history, problem list and allergies were reviewed in detail with the patient  Goals were established with regard to weight loss, exercise, and  diet in compliance with medications  End of life planning was discussed.   Review of Systems  Constitutional: Negative.   HENT:  Negative.   Eyes: Negative.   Respiratory: Negative.   Cardiovascular: Negative.   Gastrointestinal: Negative.   Endocrine: Negative.   Genitourinary: Negative.   Musculoskeletal: Negative.   Neurological: Negative.   Hematological: Negative.   Psychiatric/Behavioral: Negative.   All other systems reviewed and are negative.  Past Medical History:  Diagnosis Date  . Cancer ([place]) 05/19/10   seminoma L testicle  . History of radiation therapy 06/16/10 to 07/08/10   paraaortic region  . Hypertension   . Seminoma of testis [facility])     Social History   Socioeconomic History  . Marital status: Married    Spouse name: Not on file  . Number of children: 3  . Years of education: Not on file  . Highest education level: Not on file  Occupational History  . Occupation: Scientist, clinical (histocompatibility and immunogenetics): [employer]  Tobacco Use  . Smoking status: Never Smoker  . Smokeless tobacco: Never Used  Substance and Sexual Activity  . Alcohol use: Yes    Comment: occassional  . Drug use: No  . Sexual activity: Not on file  Other Topics Concern  . Not on file  Social History Narrative   Married   Works in Press photographer      He likes to go to [employer] and do dirt bikes with his son.    Social Determinants of Health   Financial Resource Strain:   . Difficulty of Paying Living Expenses: Not on file  Food Insecurity:   . Worried About Charity fundraiser  in the Last Year: Not on file  . Ran Out of Food in the Last Year: Not on file  Transportation Needs:   . Lack of Transportation (Medical): Not on file  . Lack of Transportation (Non-Medical): Not on file  Physical Activity:   . Days of Exercise per Week: Not on file  . Minutes of Exercise per Session: Not on file  Stress:   . Feeling of Stress : Not on file  Social Connections:   . Frequency of Communication with Friends and Family: Not on file  . Frequency of Social Gatherings with Friends and Family: Not on file  . Attends Religious  Services: Not on file  . Active Member of Clubs or Organizations: Not on file  . Attends Archivist Meetings: Not on file  . Marital Status: Not on file  Intimate Partner Violence:   . Fear of Current or Ex-Partner: Not on file  . Emotionally Abused: Not on file  . Physically Abused: Not on file  . Sexually Abused: Not on file    Past Surgical History:  Procedure Laterality Date  . EYE SURGERY     age 89  . ORCHIECTOMY  05/19/2010   left inguinal orchiectomy  . TESTICULAR EXPLORATION Left 05/19/2010    Family History  Problem Relation Age of Onset  . Hypertension Mother   . Hypertension Father   . Diabetes Mellitus II Father   . Ovarian cancer Maternal Grandmother        ovarian  . Diabetes Maternal Grandfather   . Stroke Maternal Grandfather   . Colon cancer Neg Hx   . Breast cancer Neg Hx     No Known Allergies  Current Outpatient Medications on File Prior to Visit  Medication Sig Dispense Refill  . amLODipine (NORVASC) 5 MG tablet TAKE 1 TABLET(5 MG) BY MOUTH DAILY 90 tablet 0  . aspirin EC 81 MG tablet Take 81 mg by mouth daily.    Marland Kitchen omeprazole (PRILOSEC) 20 MG capsule TAKE 1 CAPSULE(20 MG) BY MOUTH DAILY 90 capsule 0  . simvastatin (ZOCOR) 5 MG tablet Take 1 tablet (5 mg total) by mouth at bedtime. 90 tablet 0  . valsartan (DIOVAN) 160 MG tablet Take 1 tablet (160 mg total) by mouth daily. 90 tablet 0   No current facility-administered medications on file prior to visit.    BP 140/80   Pulse 60   Temp 98 F (36.7 C) (Other (Comment))   Ht 5\' 11"  (1.803 m)   Wt 190 lb (86.2 kg)   SpO2 96%   BMI 26.50 kg/m       Objective:   Physical Exam Vitals and nursing note reviewed.  Constitutional:      Appearance: Normal appearance.  HENT:     Right Ear: Tympanic membrane, ear canal and external ear normal. There is no impacted cerumen.     Left Ear: Tympanic membrane, ear canal and external ear normal. There is no impacted cerumen.     Nose: Nose  normal. No congestion or rhinorrhea.     Mouth/Throat:     Mouth: Mucous membranes are moist.     Pharynx: Oropharynx is clear.  Eyes:     Extraocular Movements: Extraocular movements intact.     Pupils: Pupils are equal, round, and reactive to light.  Cardiovascular:     Rate and Rhythm: Normal rate and regular rhythm.     Pulses: Normal pulses.     Heart sounds: Normal heart sounds. No murmur.  No friction rub. No gallop.   Pulmonary:     Effort: Pulmonary effort is normal. No respiratory distress.     Breath sounds: Normal breath sounds. No stridor. No wheezing, rhonchi or rales.  Chest:     Chest wall: No tenderness.  Abdominal:     General: Abdomen is flat. Bowel sounds are normal. There is no distension.     Palpations: Abdomen is soft. There is no mass.     Tenderness: There is no abdominal tenderness. There is no right CVA tenderness, left CVA tenderness, guarding or rebound.     Hernia: A hernia is present. Hernia is present in the umbilical area.  Musculoskeletal:        General: No swelling, tenderness, deformity or signs of injury. Normal range of motion.     Right lower leg: No edema.     Left lower leg: No edema.  Skin:    General: Skin is warm and dry.     Coloration: Skin is not jaundiced or pale.     Findings: No bruising, erythema, lesion or rash.  Neurological:     General: No focal deficit present.     Mental Status: He is alert and oriented to person, place, and time.  Psychiatric:        Mood and Affect: Mood normal.        Behavior: Behavior normal.        Thought Content: Thought content normal.        Judgment: Judgment normal.       Assessment & Plan:  1. Routine general medical examination at a health care facility - Follow up in one year or sooner if needed - CBC with Differential/Platelet - Comprehensive metabolic panel - Lipid panel - TSH  2. Essential hypertension - no change in medication - CBC with Differential/Platelet -  Comprehensive metabolic panel - Lipid panel - TSH  3. Mixed hyperlipidemia - consider increase in statin  - CBC with Differential/Platelet - Comprehensive metabolic panel - Lipid panel - TSH  4. Umbilical hernia without obstruction and without gangrene  - Ambulatory referral to [facility]

## 2019-07-02 NOTE — Patient Instructions (Signed)
Health Maintenance Due  Topic Date Due  . HIV Screening  07/12/1986  . INFLUENZA VACCINE  12/28/2018    No flowsheet data found.

## 2019-07-21 ENCOUNTER — Encounter: Payer: Self-pay | Admitting: Adult Health

## 2019-07-22 ENCOUNTER — Other Ambulatory Visit: Payer: Self-pay | Admitting: Adult Health

## 2019-07-22 MED ORDER — SIMVASTATIN 10 MG PO TABS: 10.0000 mg | ORAL_TABLET | Freq: Every day | ORAL | 3 refills | Status: DC

## 2019-08-01 ENCOUNTER — Other Ambulatory Visit: Payer: Self-pay | Admitting: Adult Health

## 2019-08-01 DIAGNOSIS — I10 Essential (primary) hypertension: Secondary | ICD-10-CM

## 2019-08-03 ENCOUNTER — Other Ambulatory Visit: Payer: Self-pay | Admitting: Adult Health

## 2019-08-05 NOTE — Telephone Encounter (Signed)
Sent to the pharmacy by e-scribe. 

## 2019-08-06 NOTE — Telephone Encounter (Signed)
SENT TO THE PHARMACY BY E-SCRIBE. 

## 2019-08-10 ENCOUNTER — Other Ambulatory Visit: Payer: Self-pay | Admitting: Adult Health

## 2019-08-10 DIAGNOSIS — I10 Essential (primary) hypertension: Secondary | ICD-10-CM

## 2019-08-13 NOTE — Telephone Encounter (Signed)
SENT TO THE PHARMACY BY E-SCRIBE. 

## 2019-08-21 ENCOUNTER — Ambulatory Visit: Payer: Self-pay | Admitting: Surgery

## 2019-08-21 NOTE — H&P (View-Only) (Signed)
Surgical H&P   Chief Complaint: hernia  HPI: Very pleasant otherwise healthy 49 year old gentleman who returns discussed umbilical hernia surgery.  We met 2 years ago at which point the intended surgery but did not proceed.  He has been noticing more discomfort in the area, with certain movements or positions.  The hernia has not changed in size.   Initial visit 09/08/16: Nice 49yo man who has had a chronically incarcerated umbilical hernia for about 10 years. It does not bother him or cause pain, but he has started to notice discomfort there if he bends or twists a certain way. He does not think it has increased significantly in size. No nausea or vomiting, abdominal distention, or change in bowel habits. No fevers. He is interested in having it repaired and requesting further information about umbilical hernias.Marland KitchenMarland KitchenHe had several questions about hernia, hernia surgery, and risks of not having repaired. I described to him umbilical hernia repair and offered him surgical repair which I do advise if it is starting to cause him symptoms. We discussed the possible use of mesh. Discussed risk of hernia recurrence after surgery, discussed risks of infection, bleeding, pain, scarring and risks of general anesthesia. We also discussed the risks of foregoing hernia surgery and continuing expected management. I described to him that the hernia can increase in size or become more painful, as well as a small risk of bowel incarceration and strangulation which would make repair and emergency. Discussed signs and symptoms of that to be aware of. He is interested in surgery and would like to know how much of cost. We will get him to speak with our schedulers.  No Known Allergies  Past Medical History:  Diagnosis Date  . Cancer ([place]) 05/19/10   seminoma L testicle  . History of radiation therapy 06/16/10 to 07/08/10   paraaortic region  . Hypertension   . Seminoma of testis [facility])     Past Surgical History:   Procedure Laterality Date  . EYE SURGERY     age 34  . ORCHIECTOMY  05/19/2010   left inguinal orchiectomy  . TESTICULAR EXPLORATION Left 05/19/2010    Family History  Problem Relation Age of Onset  . Hypertension Mother   . Hypertension Father   . Diabetes Mellitus II Father   . Ovarian cancer Maternal Grandmother        ovarian  . Diabetes Maternal Grandfather   . Stroke Maternal Grandfather   . Colon cancer Neg Hx   . Breast cancer Neg Hx     Social History   Socioeconomic History  . Marital status: Married    Spouse name: Not on file  . Number of children: 3  . Years of education: Not on file  . Highest education level: Not on file  Occupational History  . Occupation: Scientist, clinical (histocompatibility and immunogenetics): [employer]  Tobacco Use  . Smoking status: Never Smoker  . Smokeless tobacco: Never Used  Substance and Sexual Activity  . Alcohol use: Yes    Comment: occassional  . Drug use: No  . Sexual activity: Not on file  Other Topics Concern  . Not on file  Social History Narrative   Married   Works in Press photographer      He likes to go to [employer] and do dirt bikes with his son.    Social Determinants of Health   Financial Resource Strain:   . Difficulty of Paying Living Expenses:   Food Insecurity:   . Worried  About Running Out of Food in the Last Year:   . [place] in the Last Year:   Transportation Needs:   . Lack of Transportation (Medical):   Marland Kitchen Lack of Transportation (Non-Medical):   Physical Activity:   . Days of Exercise per Week:   . Minutes of Exercise per Session:   Stress:   . Feeling of Stress :   Social Connections:   . Frequency of Communication with Friends and Family:   . Frequency of Social Gatherings with Friends and Family:   . Attends Religious Services:   . Active Member of Clubs or Organizations:   . Attends Archivist Meetings:   Marland Kitchen Marital Status:     Current Outpatient Medications on File Prior to Visit  Medication  Sig Dispense Refill  . amLODipine (NORVASC) 5 MG tablet TAKE 1 TABLET(5 MG) BY MOUTH DAILY 90 tablet 3  . omeprazole (PRILOSEC) 20 MG capsule TAKE 1 CAPSULE(20 MG) BY MOUTH DAILY 90 capsule 3  . simvastatin (ZOCOR) 10 MG tablet Take 1 tablet (10 mg total) by mouth at bedtime. 90 tablet 3  . simvastatin (ZOCOR) 5 MG tablet TAKE 1 TABLET(5 MG) BY MOUTH AT BEDTIME 90 tablet 3  . valsartan (DIOVAN) 160 MG tablet TAKE 1 TABLET BY MOUTH EVERY DAY 90 tablet 3   No current facility-administered medications on file prior to visit.    Review of Systems: a complete, 10pt review of systems was completed with pertinent positives and negatives as documented in the HPI  Physical Exam: A&O x 3 Small incarcerated umbilical hernia   CBC Latest Ref Rng & Units 07/02/2019 04/18/2018 07/19/2011  WBC 4.0 - 10.5 K/uL 6.2 5.0 4.0(L)  Hemoglobin 13.0 - 17.0 g/dL 15.8 16.3 16.0  Hematocrit 39.0 - 52.0 % 46.4 49.1 47.3  Platelets 150.0 - 400.0 K/uL 200.0 213 186.0    CMP Latest Ref Rng & Units 07/02/2019 04/18/2018 03/14/2016  Glucose 70 - 99 mg/dL 90 114(H) 102(H)  BUN 6 - 23 mg/dL '11 11 11  ' Creatinine 0.40 - 1.50 mg/dL 0.85 0.97 0.89  Sodium 135 - 145 mEq/L 141 140 140  Potassium 3.5 - 5.1 mEq/L 3.9 3.6 4.1  Chloride 96 - 112 mEq/L 104 109 102  CO2 19 - 32 mEq/L '27 22 26  ' Calcium 8.4 - 10.5 mg/dL 9.3 9.3 9.8  Total Protein 6.0 - 8.3 g/dL 7.4 7.3 7.7  Total Bilirubin 0.2 - 1.2 mg/dL 0.7 0.8 0.8  Alkaline Phos 39 - 117 U/L 58 48 64  AST 0 - 37 U/L '25 30 22  ' ALT 0 - 53 U/L '29 28 25    ' No results found for: INR, PROTIME  Imaging: No results found.   A/P: we'll go ahead and schedule open umbilical hernia repair, possibly with mesh depending on size of the fascial defect.  We again discussed risks of surgery, risk of recurrence.  Questions answered.  Patient Active Problem List   Diagnosis Date Noted  . Umbilical hernia 16/96/7893  . History of radiation therapy   . Preventative health care  07/26/2011  . Hyperlipidemia 07/15/2010  . Cancer ([place]) 05/19/2010  . Essential hypertension 05/12/2010  . SEMINOMA 05/12/2010       Romana Juniper, MD [facility]  See AMION to contact appropriate on-call provider

## 2019-08-21 NOTE — H&P (Signed)
Surgical H&P   Chief Complaint: hernia  HPI: Very pleasant otherwise healthy 49 year old gentleman who returns discussed umbilical hernia surgery.  We met 2 years ago at which point the intended surgery but did not proceed.  He has been noticing more discomfort in the area, with certain movements or positions.  The hernia has not changed in size.   Initial visit 09/08/16: Nice 49yo man who has had a chronically incarcerated umbilical hernia for about 10 years. It does not bother him or cause pain, but he has started to notice discomfort there if he bends or twists a certain way. He does not think it has increased significantly in size. No nausea or vomiting, abdominal distention, or change in bowel habits. No fevers. He is interested in having it repaired and requesting further information about umbilical hernias.Marland KitchenMarland KitchenHe had several questions about hernia, hernia surgery, and risks of not having repaired. I described to him umbilical hernia repair and offered him surgical repair which I do advise if it is starting to cause him symptoms. We discussed the possible use of mesh. Discussed risk of hernia recurrence after surgery, discussed risks of infection, bleeding, pain, scarring and risks of general anesthesia. We also discussed the risks of foregoing hernia surgery and continuing expected management. I described to him that the hernia can increase in size or become more painful, as well as a small risk of bowel incarceration and strangulation which would make repair and emergency. Discussed signs and symptoms of that to be aware of. He is interested in surgery and would like to know how much of cost. We will get him to speak with our schedulers.  No Known Allergies  Past Medical History:  Diagnosis Date  . Cancer ([place]) 05/19/10   seminoma L testicle  . History of radiation therapy 06/16/10 to 07/08/10   paraaortic region  . Hypertension   . Seminoma of testis [facility])     Past Surgical History:   Procedure Laterality Date  . EYE SURGERY     age 52  . ORCHIECTOMY  05/19/2010   left inguinal orchiectomy  . TESTICULAR EXPLORATION Left 05/19/2010    Family History  Problem Relation Age of Onset  . Hypertension Mother   . Hypertension Father   . Diabetes Mellitus II Father   . Ovarian cancer Maternal Grandmother        ovarian  . Diabetes Maternal Grandfather   . Stroke Maternal Grandfather   . Colon cancer Neg Hx   . Breast cancer Neg Hx     Social History   Socioeconomic History  . Marital status: Married    Spouse name: Not on file  . Number of children: 3  . Years of education: Not on file  . Highest education level: Not on file  Occupational History  . Occupation: Scientist, clinical (histocompatibility and immunogenetics): [employer]  Tobacco Use  . Smoking status: Never Smoker  . Smokeless tobacco: Never Used  Substance and Sexual Activity  . Alcohol use: Yes    Comment: occassional  . Drug use: No  . Sexual activity: Not on file  Other Topics Concern  . Not on file  Social History Narrative   Married   Works in Press photographer      He likes to go to [employer] and do dirt bikes with his son.    Social Determinants of Health   Financial Resource Strain:   . Difficulty of Paying Living Expenses:   Food Insecurity:   . Worried  About Running Out of Food in the Last Year:   . [place] in the Last Year:   Transportation Needs:   . Lack of Transportation (Medical):   Marland Kitchen Lack of Transportation (Non-Medical):   Physical Activity:   . Days of Exercise per Week:   . Minutes of Exercise per Session:   Stress:   . Feeling of Stress :   Social Connections:   . Frequency of Communication with Friends and Family:   . Frequency of Social Gatherings with Friends and Family:   . Attends Religious Services:   . Active Member of Clubs or Organizations:   . Attends Archivist Meetings:   Marland Kitchen Marital Status:     Current Outpatient Medications on File Prior to Visit  Medication  Sig Dispense Refill  . amLODipine (NORVASC) 5 MG tablet TAKE 1 TABLET(5 MG) BY MOUTH DAILY 90 tablet 3  . omeprazole (PRILOSEC) 20 MG capsule TAKE 1 CAPSULE(20 MG) BY MOUTH DAILY 90 capsule 3  . simvastatin (ZOCOR) 10 MG tablet Take 1 tablet (10 mg total) by mouth at bedtime. 90 tablet 3  . simvastatin (ZOCOR) 5 MG tablet TAKE 1 TABLET(5 MG) BY MOUTH AT BEDTIME 90 tablet 3  . valsartan (DIOVAN) 160 MG tablet TAKE 1 TABLET BY MOUTH EVERY DAY 90 tablet 3   No current facility-administered medications on file prior to visit.    Review of Systems: a complete, 10pt review of systems was completed with pertinent positives and negatives as documented in the HPI  Physical Exam: A&O x 3 Small incarcerated umbilical hernia   CBC Latest Ref Rng & Units 07/02/2019 04/18/2018 07/19/2011  WBC 4.0 - 10.5 K/uL 6.2 5.0 4.0(L)  Hemoglobin 13.0 - 17.0 g/dL 15.8 16.3 16.0  Hematocrit 39.0 - 52.0 % 46.4 49.1 47.3  Platelets 150.0 - 400.0 K/uL 200.0 213 186.0    CMP Latest Ref Rng & Units 07/02/2019 04/18/2018 03/14/2016  Glucose 70 - 99 mg/dL 90 114(H) 102(H)  BUN 6 - 23 mg/dL '11 11 11  ' Creatinine 0.40 - 1.50 mg/dL 0.85 0.97 0.89  Sodium 135 - 145 mEq/L 141 140 140  Potassium 3.5 - 5.1 mEq/L 3.9 3.6 4.1  Chloride 96 - 112 mEq/L 104 109 102  CO2 19 - 32 mEq/L '27 22 26  ' Calcium 8.4 - 10.5 mg/dL 9.3 9.3 9.8  Total Protein 6.0 - 8.3 g/dL 7.4 7.3 7.7  Total Bilirubin 0.2 - 1.2 mg/dL 0.7 0.8 0.8  Alkaline Phos 39 - 117 U/L 58 48 64  AST 0 - 37 U/L '25 30 22  ' ALT 0 - 53 U/L '29 28 25    ' No results found for: INR, PROTIME  Imaging: No results found.   A/P: we'll go ahead and schedule open umbilical hernia repair, possibly with mesh depending on size of the fascial defect.  We again discussed risks of surgery, risk of recurrence.  Questions answered.  Patient Active Problem List   Diagnosis Date Noted  . Umbilical hernia 61/22/4497  . History of radiation therapy   . Preventative health care  07/26/2011  . Hyperlipidemia 07/15/2010  . Cancer ([place]) 05/19/2010  . Essential hypertension 05/12/2010  . SEMINOMA 05/12/2010       Romana Juniper, MD [facility]  See AMION to contact appropriate on-call provider

## 2019-09-04 ENCOUNTER — Other Ambulatory Visit: Payer: Self-pay

## 2019-09-04 ENCOUNTER — Encounter (HOSPITAL_BASED_OUTPATIENT_CLINIC_OR_DEPARTMENT_OTHER): Payer: Self-pay | Admitting: Surgery

## 2019-09-09 ENCOUNTER — Other Ambulatory Visit (HOSPITAL_COMMUNITY)
Admission: RE | Admit: 2019-09-09 | Discharge: 2019-09-09 | Disposition: A | Discharge status: Home / Self Care | Payer: Commercial Managed Care - PPO | Source: Ambulatory Visit | Attending: Surgery | Admitting: Surgery

## 2019-09-09 DIAGNOSIS — Z01812 Encounter for preprocedural laboratory examination: Secondary | ICD-10-CM | POA: Insufficient documentation

## 2019-09-09 DIAGNOSIS — Z20822 Contact with and (suspected) exposure to covid-19: Secondary | ICD-10-CM | POA: Diagnosis not present

## 2019-09-09 LAB — SARS CORONAVIRUS 2 (TAT 6-24 HRS): SARS Coronavirus 2: NEGATIVE

## 2019-09-09 NOTE — Patient Instructions (Signed)
DUE TO COVID-19 ONLY ONE VISITOR IS ALLOWED TO COME WITH YOU AND STAY IN THE WAITING ROOM ONLY DURING PRE OP AND PROCEDURE DAY OF SURGERY. THE 1 VISITOR MAY VISIT WITH YOU AFTER SURGERY IN YOUR PRIVATE ROOM DURING VISITING HOURS ONLY!  YOU NEED TO HAVE A COVID 19 TEST ON: 09/09/19 , THIS TEST MUST BE DONE BEFORE SURGERY, COME  [place], [place] , [address].  ([place]) ONCE YOUR COVID TEST IS COMPLETED, PLEASE BEGIN THE QUARANTINE INSTRUCTIONS AS OUTLINED IN YOUR HANDOUT.                Jordan Beasley   Your procedure is scheduled on: 09/12/19   Report to [facility] Main  Entrance   Report to admitting at: 1:00 PM     Call this number if you have problems the morning of surgery [phone]    Remember: Do not eat solid food  :After Midnight. From midnight until 12:00 pm you can have clear liquids diet.  CLEAR LIQUID DIET   Foods Allowed                                                                     Foods Excluded  Coffee and tea, regular and decaf                             liquids that you cannot  Plain Jell-O any favor except red or purple                                           see through such as: Fruit ices (not with fruit pulp)                                     milk, soups, orange juice  Iced Popsicles                                    All solid food Carbonated beverages, regular and diet                                    Cranberry, grape and apple juices Sports drinks like Gatorade Lightly seasoned clear broth or consume(fat free) Sugar, honey syrup  Sample Menu Breakfast                                Lunch                                     Supper Cranberry juice                    Beef broth  Chicken broth Jell-O                                     Grape juice                           Apple juice Coffee or tea                        Jell-O                                      Popsicle                                                 Coffee or tea                        Coffee or tea  _____________________________________________________________________   BRUSH YOUR TEETH MORNING OF SURGERY AND RINSE YOUR MOUTH OUT, NO CHEWING GUM CANDY OR MINTS.     Take these medicines the morning of surgery with A SIP OF WATER: amlodipine,omeprazole.                                  You may not have any metal on your body including hair pins and              piercings  Do not wear jewelry, make-up, lotions, powders or perfumes, deodorant             Men may shave face and neck.   Do not bring valuables to the hospital. [place].  Contacts, dentures or bridgework may not be worn into surgery.  Leave suitcase in the car. After surgery it may be brought to your room.     Patients discharged the day of surgery will not be allowed to drive home. IF YOU ARE HAVING SURGERY AND GOING HOME THE SAME DAY, YOU MUST HAVE AN ADULT TO DRIVE YOU HOME AND BE WITH YOU FOR 24 HOURS. YOU MAY GO HOME BY TAXI OR UBER OR ORTHERWISE, BUT AN ADULT MUST ACCOMPANY YOU HOME AND STAY WITH YOU FOR 24 HOURS.  Name and phone number of your driver:  Special Instructions: N/A              Please read over the following fact sheets you were given: _____________________________________________________________________  preparing for Surgery Before surgery, you can play an important role.  Because skin is not sterile, your skin needs to be as free of germs as possible.  You can reduce the number of germs on your skin by washing with CHG (chlorahexidine gluconate) soap before surgery.  CHG is an antiseptic cleaner which kills germs and bonds with the skin to continue killing germs even after washing. Please DO NOT use if you have an allergy to CHG or antibacterial soaps.  If your skin becomes reddened/irritated stop using the CHG and inform your nurse when you arrive at  Short  Stay. Do not shave (including legs and underarms) for at least 48 hours prior to the first CHG shower.  You may shave your face/neck. Please follow these instructions carefully:  1.  Shower with CHG Soap the night before surgery and the  morning of Surgery.  2.  If you choose to wash your hair, wash your hair first as usual with your  normal  shampoo.  3.  After you shampoo, rinse your hair and body thoroughly to remove the  shampoo.                           4.  Use CHG as you would any other liquid soap.  You can apply chg directly  to the skin and wash                       Gently with a scrungie or clean washcloth.  5.  Apply the CHG Soap to your body ONLY FROM THE NECK DOWN.   Do not use on face/ open                           Wound or open sores. Avoid contact with eyes, ears mouth and genitals (private parts).                       Wash face,  Genitals (private parts) with your normal soap.             6.  Wash thoroughly, paying special attention to the area where your surgery  will be performed.  7.  Thoroughly rinse your body with warm water from the neck down.  8.  DO NOT shower/wash with your normal soap after using and rinsing off  the CHG Soap.                9.  Pat yourself dry with a clean towel.            10.  Wear clean pajamas.            11.  Place clean sheets on your bed the night of your first shower and do not  sleep with pets. Day of Surgery : Do not apply any lotions/deodorants the morning of surgery.  Please wear clean clothes to the hospital/surgery center.  FAILURE TO FOLLOW THESE INSTRUCTIONS MAY RESULT IN THE CANCELLATION OF YOUR SURGERY PATIENT SIGNATURE_________________________________  NURSE SIGNATURE__________________________________  ________________________________________________________________________

## 2019-09-10 ENCOUNTER — Encounter (HOSPITAL_COMMUNITY)
Admission: RE | Admit: 2019-09-10 | Discharge: 2019-09-10 | Disposition: A | Discharge status: Home / Self Care | Payer: Commercial Managed Care - PPO | Source: Ambulatory Visit | Attending: Surgery | Admitting: Surgery

## 2019-09-10 ENCOUNTER — Encounter (HOSPITAL_COMMUNITY): Payer: Self-pay

## 2019-09-10 ENCOUNTER — Other Ambulatory Visit (HOSPITAL_COMMUNITY): Payer: Commercial Managed Care - PPO

## 2019-09-10 ENCOUNTER — Other Ambulatory Visit: Payer: Self-pay

## 2019-09-10 DIAGNOSIS — I10 Essential (primary) hypertension: Secondary | ICD-10-CM | POA: Diagnosis not present

## 2019-09-10 DIAGNOSIS — Z01818 Encounter for other preprocedural examination: Secondary | ICD-10-CM | POA: Diagnosis not present

## 2019-09-10 HISTORY — DX: Other complications of anesthesia, initial encounter: T88.59XA

## 2019-09-10 LAB — BASIC METABOLIC PANEL
Anion gap: 12 (ref 5–15)
BUN: 10 mg/dL (ref 6–20)
CO2: 22 mmol/L (ref 22–32)
Calcium: 8.9 mg/dL (ref 8.9–10.3)
Chloride: 105 mmol/L (ref 98–111)
Creatinine, Ser: 0.85 mg/dL (ref 0.61–1.24)
GFR calc Af Amer: >60 mL/min (ref >60)
GFR calc non Af Amer: >60 mL/min (ref >60)
Glucose, Bld: 91 mg/dL (ref 70–99)
Potassium: 4.9 mmol/L (ref 3.5–5.1)
Sodium: 139 mmol/L (ref 135–145)

## 2019-09-10 LAB — CBC
HCT: 53.9 % — ABNORMAL HIGH (ref 39.0–52.0)
Hemoglobin: 17.4 g/dL — ABNORMAL HIGH (ref 13.0–17.0)
MCH: 30.3 pg (ref 26.0–34.0)
MCHC: 32.3 g/dL (ref 30.0–36.0)
MCV: 93.7 fL (ref 80.0–100.0)
Platelets: 246 10*3/uL (ref 150–400)
RBC: 5.75 MIL/uL (ref 4.22–5.81)
RDW: 12.2 % (ref 11.5–15.5)
WBC: 5.2 10*3/uL (ref 4.0–10.5)
nRBC: 0 % (ref 0.0–0.2)

## 2019-09-10 NOTE — Progress Notes (Signed)
PCP - Dorothyann Peng. NP. LOV: 07/02/19. Cardiologist -   Chest x-ray -  EKG -  Stress Test -  ECHO -  Cardiac Cath -   Sleep Study -  CPAP -   Fasting Blood Sugar -  Checks Blood Sugar _____ times a day  Blood Thinner Instructions: Aspirin Instructions: Last Dose:  Anesthesia review:   Patient denies shortness of breath, fever, cough and chest pain at PAT appointment   Patient verbalized understanding of instructions that were given to them at the PAT appointment. Patient was also instructed that they will need to review over the PAT instructions again at home before surgery.

## 2019-09-11 MED ORDER — BUPIVACAINE LIPOSOME 1.3 % IJ SUSP
20.0000 mL | INTRAMUSCULAR | Status: DC
  Filled 2019-09-11: qty 20

## 2019-09-12 ENCOUNTER — Ambulatory Visit (HOSPITAL_COMMUNITY)
Admission: RE | Admit: 2019-09-12 | Discharge: 2019-09-12 | Disposition: A | Discharge status: Home / Self Care | Payer: Commercial Managed Care - PPO | Source: Ambulatory Visit | Attending: Surgery | Admitting: Surgery

## 2019-09-12 ENCOUNTER — Other Ambulatory Visit: Payer: Self-pay

## 2019-09-12 ENCOUNTER — Encounter (HOSPITAL_COMMUNITY): Payer: Self-pay | Admitting: Surgery

## 2019-09-12 ENCOUNTER — Ambulatory Visit (HOSPITAL_COMMUNITY): Payer: Commercial Managed Care - PPO | Admitting: Anesthesiology

## 2019-09-12 ENCOUNTER — Encounter (HOSPITAL_COMMUNITY)
Admission: RE | Disposition: A | Discharge status: Home / Self Care | Payer: Self-pay | Source: Ambulatory Visit | Attending: Surgery

## 2019-09-12 DIAGNOSIS — Z8547 Personal history of malignant neoplasm of testis: Secondary | ICD-10-CM | POA: Diagnosis not present

## 2019-09-12 DIAGNOSIS — Z79899 Other long term (current) drug therapy: Secondary | ICD-10-CM | POA: Insufficient documentation

## 2019-09-12 DIAGNOSIS — I10 Essential (primary) hypertension: Secondary | ICD-10-CM | POA: Insufficient documentation

## 2019-09-12 DIAGNOSIS — K420 Umbilical hernia with obstruction, without gangrene: Secondary | ICD-10-CM | POA: Insufficient documentation

## 2019-09-12 DIAGNOSIS — E785 Hyperlipidemia, unspecified: Secondary | ICD-10-CM | POA: Diagnosis not present

## 2019-09-12 HISTORY — PX: UMBILICAL HERNIA REPAIR: SHX196

## 2019-09-12 SURGERY — REPAIR, HERNIA, UMBILICAL, ADULT
Anesthesia: General

## 2019-09-12 MED ORDER — CHLORHEXIDINE GLUCONATE 4 % EX LIQD: 60.0000 mL | Freq: Once | CUTANEOUS | Status: DC

## 2019-09-12 MED ORDER — FENTANYL CITRATE (PF) 100 MCG/2ML IJ SOLN: 50.0000 ug | INTRAMUSCULAR | Status: DC | PRN

## 2019-09-12 MED ORDER — LACTATED RINGERS IV SOLN: INTRAVENOUS | Status: DC

## 2019-09-12 MED ORDER — SODIUM CHLORIDE 0.9 % IV SOLN: 250.0000 mL | INTRAVENOUS | Status: DC | PRN

## 2019-09-12 MED ORDER — FENTANYL CITRATE (PF) 250 MCG/5ML IJ SOLN
INTRAMUSCULAR | Status: DC | PRN
  Administered 2019-09-12: 50 ug via INTRAVENOUS
  Administered 2019-09-12: 100 ug via INTRAVENOUS

## 2019-09-12 MED ORDER — SUGAMMADEX SODIUM 500 MG/5ML IV SOLN
INTRAVENOUS | Status: AC
  Filled 2019-09-12: qty 5

## 2019-09-12 MED ORDER — DOCUSATE SODIUM 100 MG PO CAPS: 100.0000 mg | ORAL_CAPSULE | Freq: Two times a day (BID) | ORAL | 0 refills | Status: AC

## 2019-09-12 MED ORDER — BUPIVACAINE HCL 0.25 % IJ SOLN
INTRAMUSCULAR | Status: AC
  Filled 2019-09-12: qty 1

## 2019-09-12 MED ORDER — LIDOCAINE 2% (20 MG/ML) 5 ML SYRINGE
INTRAMUSCULAR | Status: DC | PRN
  Administered 2019-09-12: 75 mg via INTRAVENOUS

## 2019-09-12 MED ORDER — ROCURONIUM BROMIDE 10 MG/ML (PF) SYRINGE
PREFILLED_SYRINGE | INTRAVENOUS | Status: DC | PRN
  Administered 2019-09-12: 80 mg via INTRAVENOUS

## 2019-09-12 MED ORDER — ACETAMINOPHEN 500 MG PO TABS: 1000.0000 mg | ORAL_TABLET | ORAL | Status: DC

## 2019-09-12 MED ORDER — SODIUM CHLORIDE 0.9% FLUSH: 3.0000 mL | Freq: Two times a day (BID) | INTRAVENOUS | Status: DC

## 2019-09-12 MED ORDER — MIDAZOLAM HCL 2 MG/2ML IJ SOLN
INTRAMUSCULAR | Status: AC
  Filled 2019-09-12: qty 2

## 2019-09-12 MED ORDER — PROPOFOL 10 MG/ML IV BOLUS
INTRAVENOUS | Status: DC | PRN
  Administered 2019-09-12: 160 mg via INTRAVENOUS
  Administered 2019-09-12: 40 mg via INTRAVENOUS

## 2019-09-12 MED ORDER — MIDAZOLAM HCL 2 MG/2ML IJ SOLN: 1.0000 mg | INTRAMUSCULAR | Status: DC | PRN

## 2019-09-12 MED ORDER — LIDOCAINE 2% (20 MG/ML) 5 ML SYRINGE
INTRAMUSCULAR | Status: AC
  Filled 2019-09-12: qty 5

## 2019-09-12 MED ORDER — BUPIVACAINE-EPINEPHRINE 0.25% -1:200000 IJ SOLN
INTRAMUSCULAR | Status: DC | PRN
  Administered 2019-09-12: 10 mL

## 2019-09-12 MED ORDER — OXYCODONE HCL 5 MG PO TABS
5.0000 mg | ORAL_TABLET | ORAL | Status: DC | PRN
  Administered 2019-09-12: 5 mg via ORAL

## 2019-09-12 MED ORDER — SUGAMMADEX SODIUM 500 MG/5ML IV SOLN
INTRAVENOUS | Status: DC | PRN
  Administered 2019-09-12: 350 mg via INTRAVENOUS

## 2019-09-12 MED ORDER — ONDANSETRON HCL 4 MG/2ML IJ SOLN: 4.0000 mg | Freq: Once | INTRAMUSCULAR | Status: DC | PRN

## 2019-09-12 MED ORDER — HYDROMORPHONE HCL 1 MG/ML IJ SOLN: 0.2500 mg | INTRAMUSCULAR | Status: DC | PRN

## 2019-09-12 MED ORDER — OXYCODONE HCL 5 MG PO TABS
ORAL_TABLET | ORAL | Status: AC
  Filled 2019-09-12: qty 1

## 2019-09-12 MED ORDER — ONDANSETRON HCL 4 MG/2ML IJ SOLN
INTRAMUSCULAR | Status: AC
  Filled 2019-09-12: qty 2

## 2019-09-12 MED ORDER — 0.9 % SODIUM CHLORIDE (POUR BTL) OPTIME
TOPICAL | Status: DC | PRN
  Administered 2019-09-12: 100 mL

## 2019-09-12 MED ORDER — ACETAMINOPHEN 325 MG PO TABS: 650.0000 mg | ORAL_TABLET | ORAL | Status: DC | PRN

## 2019-09-12 MED ORDER — ACETAMINOPHEN 650 MG RE SUPP
650.0000 mg | RECTAL | Status: DC | PRN
  Filled 2019-09-12: qty 1

## 2019-09-12 MED ORDER — ROCURONIUM BROMIDE 10 MG/ML (PF) SYRINGE
PREFILLED_SYRINGE | INTRAVENOUS | Status: AC
  Filled 2019-09-12: qty 10

## 2019-09-12 MED ORDER — DEXAMETHASONE SODIUM PHOSPHATE 10 MG/ML IJ SOLN
INTRAMUSCULAR | Status: AC
  Filled 2019-09-12: qty 1

## 2019-09-12 MED ORDER — CEFAZOLIN SODIUM-DEXTROSE 2-4 GM/100ML-% IV SOLN
2.0000 g | INTRAVENOUS | Status: AC
  Administered 2019-09-12: 2 g via INTRAVENOUS
  Filled 2019-09-12: qty 100

## 2019-09-12 MED ORDER — DEXAMETHASONE SODIUM PHOSPHATE 10 MG/ML IJ SOLN
INTRAMUSCULAR | Status: DC | PRN
  Administered 2019-09-12: 10 mg via INTRAVENOUS

## 2019-09-12 MED ORDER — FENTANYL CITRATE (PF) 250 MCG/5ML IJ SOLN
INTRAMUSCULAR | Status: AC
  Filled 2019-09-12: qty 5

## 2019-09-12 MED ORDER — ONDANSETRON HCL 4 MG/2ML IJ SOLN
INTRAMUSCULAR | Status: DC | PRN
  Administered 2019-09-12: 4 mg via INTRAVENOUS

## 2019-09-12 MED ORDER — ACETAMINOPHEN 500 MG PO TABS
1000.0000 mg | ORAL_TABLET | ORAL | Status: AC
  Administered 2019-09-12: 1000 mg via ORAL
  Filled 2019-09-12: qty 2

## 2019-09-12 MED ORDER — MEPERIDINE HCL 50 MG/ML IJ SOLN: 6.2500 mg | INTRAMUSCULAR | Status: DC | PRN

## 2019-09-12 MED ORDER — MIDAZOLAM HCL 5 MG/5ML IJ SOLN
INTRAMUSCULAR | Status: DC | PRN
  Administered 2019-09-12: 2 mg via INTRAVENOUS

## 2019-09-12 MED ORDER — SODIUM CHLORIDE 0.9% FLUSH: 3.0000 mL | INTRAVENOUS | Status: DC | PRN

## 2019-09-12 MED ORDER — OXYCODONE HCL 5 MG PO TABS: 5.0000 mg | ORAL_TABLET | Freq: Three times a day (TID) | ORAL | 0 refills | Status: DC | PRN

## 2019-09-12 MED ORDER — FENTANYL CITRATE (PF) 100 MCG/2ML IJ SOLN: 25.0000 ug | INTRAMUSCULAR | Status: DC | PRN

## 2019-09-12 SURGICAL SUPPLY — 32 items
APL PRP STRL LF DISP 70% ISPRP (MISCELLANEOUS) ×1
APL SKNCLS STERI-STRIP NONHPOA (GAUZE/BANDAGES/DRESSINGS) ×1
BENZOIN TINCTURE PRP APPL 2/3 (GAUZE/BANDAGES/DRESSINGS) ×2 IMPLANT
CHLORAPREP W/TINT 26 (MISCELLANEOUS) ×3 IMPLANT
CLOSURE STERI-STRIP 1/2X4 (GAUZE/BANDAGES/DRESSINGS) ×1
CLOSURE WOUND 1/2 X4 (GAUZE/BANDAGES/DRESSINGS) ×1
CLSR STERI-STRIP ANTIMIC 1/2X4 (GAUZE/BANDAGES/DRESSINGS) ×1 IMPLANT
COVER SURGICAL LIGHT HANDLE (MISCELLANEOUS) ×3 IMPLANT
COVER WAND RF STERILE (DRAPES) IMPLANT
DECANTER SPIKE VIAL GLASS SM (MISCELLANEOUS) ×3 IMPLANT
DRAPE LAPAROSCOPIC ABDOMINAL (DRAPES) ×3 IMPLANT
DRSG TEGADERM 4X4.75 (GAUZE/BANDAGES/DRESSINGS) ×4 IMPLANT
ELECT REM PT RETURN 15FT ADLT (MISCELLANEOUS) ×3 IMPLANT
GAUZE SPONGE 4X4 12PLY STRL (GAUZE/BANDAGES/DRESSINGS) ×2 IMPLANT
GLOVE BIO SURGEON STRL SZ 6 (GLOVE) ×3 IMPLANT
GLOVE INDICATOR 6.5 STRL GRN (GLOVE) ×3 IMPLANT
GOWN STRL REUS W/TWL LRG LVL3 (GOWN DISPOSABLE) ×3 IMPLANT
GOWN STRL REUS W/TWL XL LVL3 (GOWN DISPOSABLE) ×3 IMPLANT
KIT BASIN OR (CUSTOM PROCEDURE TRAY) ×3 IMPLANT
KIT TURNOVER KIT A (KITS) IMPLANT
NEEDLE HYPO 22GX1.5 SAFETY (NEEDLE) IMPLANT
PACK GENERAL/GYN (CUSTOM PROCEDURE TRAY) ×3 IMPLANT
PENCIL SMOKE EVACUATOR (MISCELLANEOUS) IMPLANT
STRIP CLOSURE SKIN 1/2X4 (GAUZE/BANDAGES/DRESSINGS) ×1 IMPLANT
SUT ETHIBOND 0 MO6 C/R (SUTURE) ×2 IMPLANT
SUT MNCRL AB 4-0 PS2 18 (SUTURE) ×3 IMPLANT
SUT PROLENE 2 0 CT2 30 (SUTURE) IMPLANT
SUT VIC AB 3-0 SH 27 (SUTURE) ×3
SUT VIC AB 3-0 SH 27XBRD (SUTURE) IMPLANT
SYR CONTROL 10ML LL (SYRINGE) IMPLANT
TOWEL OR 17X26 10 PK STRL BLUE (TOWEL DISPOSABLE) ×3 IMPLANT
TOWEL OR NON WOVEN STRL DISP B (DISPOSABLE) ×3 IMPLANT

## 2019-09-12 NOTE — Anesthesia Procedure Notes (Signed)
Procedure Name: Intubation Date/Time: 09/12/2019 1:46 PM Performed by: Lollie Sails, CRNA Pre-anesthesia Checklist: Patient identified, Emergency Drugs available, Suction available, Patient being monitored and Timeout performed Patient Re-evaluated:Patient Re-evaluated prior to induction Oxygen Delivery Method: Circle system utilized Preoxygenation: Pre-oxygenation with 100% oxygen Induction Type: IV induction Ventilation: Mask ventilation without difficulty Laryngoscope Size: Miller and 3 Grade View: Grade II Tube type: Oral Tube size: 7.5 mm Number of attempts: 1 Airway Equipment and Method: Stylet Placement Confirmation: ETT inserted through vocal cords under direct vision,  positive ETCO2 and breath sounds checked- equal and bilateral Secured at: 21 cm Tube secured with: Tape Dental Injury: Teeth and Oropharynx as per pre-operative assessment

## 2019-09-12 NOTE — Anesthesia Preprocedure Evaluation (Signed)
Anesthesia Evaluation  Patient identified by MRN, date of birth, ID band Patient awake    Reviewed: Allergy & Precautions, NPO status , Patient's Chart, lab work & pertinent test results  Airway Mallampati: I  TM Distance: >3 FB Neck ROM: Full    Dental  (+) Teeth Intact, Dental Advisory Given   Pulmonary neg pulmonary ROS,    breath sounds clear to auscultation       Cardiovascular hypertension, Pt. on medications  Rhythm:Regular Rate:Normal     Neuro/Psych negative neurological ROS  negative psych ROS   GI/Hepatic Neg liver ROS, GERD  Medicated,  Endo/Other  negative endocrine ROS  Renal/GU negative Renal ROS     Musculoskeletal negative musculoskeletal ROS (+)   Abdominal Normal abdominal exam  (+)   Peds  Hematology negative hematology ROS (+)   Anesthesia Other Findings   Reproductive/Obstetrics                             Lab Results  Component Value Date   WBC 5.2 09/10/2019   HGB 17.4 (H) 09/10/2019   HCT 53.9 (H) 09/10/2019   MCV 93.7 09/10/2019   PLT 246 09/10/2019   Lab Results  Component Value Date   CREATININE 0.85 09/10/2019   BUN 10 09/10/2019   NA 139 09/10/2019   K 4.9 09/10/2019   CL 105 09/10/2019   CO2 22 09/10/2019   No results found for: INR, PROTIME   Anesthesia Physical Anesthesia Plan  ASA: II  Anesthesia Plan: General   Post-op Pain Management:    Induction: Intravenous  PONV Risk Score and Plan: 3 and Ondansetron, Dexamethasone and Midazolam  Airway Management Planned: Oral ETT  Additional Equipment: None  Intra-op Plan:   Post-operative Plan: Extubation in OR  Informed Consent: I have reviewed the patients History and Physical, chart, labs and discussed the procedure including the risks, benefits and alternatives for the proposed anesthesia with the patient or authorized representative who has indicated his/her understanding and  acceptance.     Dental advisory given  Plan Discussed with: CRNA  Anesthesia Plan Comments:         Anesthesia Quick Evaluation

## 2019-09-12 NOTE — Anesthesia Postprocedure Evaluation (Signed)
Anesthesia Post Note  Patient: Jordan Beasley  Procedure(s) Performed: UMBILICAL HERNIA REPAIR (N/A )     Patient location during evaluation: PACU Anesthesia Type: General Level of consciousness: awake and alert Pain management: pain level controlled Vital Signs Assessment: post-procedure vital signs reviewed and stable Respiratory status: spontaneous breathing, nonlabored ventilation, respiratory function stable and patient connected to nasal cannula oxygen Cardiovascular status: blood pressure returned to baseline and stable Postop Assessment: no apparent nausea or vomiting Anesthetic complications: no    Last Vitals:  Vitals:   09/12/19 1515 09/12/19 1530  BP:  139/86  Pulse:  79  Resp:  18  Temp: 36.8 C 36.7 C  SpO2:  97%                  [name]

## 2019-09-12 NOTE — Discharge Instructions (Signed)
HERNIA REPAIR: POST OP INSTRUCTIONS  ######################################################################  EAT Gradually transition to a high fiber diet with a fiber supplement over the next few weeks after discharge.  Start with a pureed / full liquid diet (see below)  WALK Walk an hour a day.  Control your pain to do that.    CONTROL PAIN Control pain so that you can walk, sleep, tolerate sneezing/coughing, and go up/down stairs.  HAVE A BOWEL MOVEMENT DAILY Keep your bowels regular to avoid problems.  OK to try a laxative to override constipation.  OK to use an antidairrheal to slow down diarrhea.  Call if not better after 2 tries  CALL IF YOU HAVE PROBLEMS/CONCERNS Call if you are still struggling despite following these instructions. Call if you have concerns not answered by these instructions  ######################################################################    1. DIET: Follow a light bland diet & liquids the first 24 hours after arrival home, such as soup, liquids, starches, etc.  Be sure to drink plenty of fluids.  Quickly advance to a usual solid diet within a few days.  Avoid fast food or heavy meals as your are more likely to get nauseated or have irregular bowels.  A low-sugar, high-fiber diet for the rest of your life is ideal.   2. Take your usually prescribed home medications unless otherwise directed.  3. PAIN CONTROL: a. Pain is best controlled by a usual combination of three different methods TOGETHER: i. Ice/Heat ii. Over the counter pain medication iii. Prescription pain medication b. Most patients will experience some swelling and bruising around the hernia(s) such as the bellybutton, groins, or old incisions.  Ice packs or heating pads (30-60 minutes up to 6 times a day) will help. Use ice for the first few days to help decrease swelling and bruising, then switch to heat to help relax tight/sore spots and speed recovery.  Some people prefer to use ice  alone, heat alone, alternating between ice & heat.  Experiment to what works for you.  Swelling and bruising can take several weeks to resolve.   c. It is helpful to take an over-the-counter pain medication regularly for the first few days: i. Naproxen (Aleve, etc)  Two 220mg  tabs twice a day OR Ibuprofen (Advil, etc) Three 200mg  tabs four times a day (every meal & bedtime) AND ii. Acetaminophen (Tylenol, etc) 325-650mg  four times a day (every meal & bedtime) d. A  prescription for pain medication should be given to you upon discharge.  Take your pain medication as prescribed.  i. If you are having problems/concerns with the prescription medicine (does not control pain, nausea, vomiting, rash, itching, etc), please call us [phone] to see if we need to switch you to a different pain medicine that will work better for you and/or control your side effect better. ii. If you need a refill on your pain medication, please contact your pharmacy.  They will contact our office to request authorization. Prescriptions will not be filled after 5 pm or on week-ends.  4. Avoid getting constipated.  Between the surgery and the pain medications, it is common to experience some constipation.  Increasing fluid intake and taking a fiber supplement (such as Metamucil, Citrucel, FiberCon, MiraLax, etc) 1-2 times a day regularly will usually help prevent this problem from occurring.  A mild laxative (prune juice, Milk of Magnesia, MiraLax, etc) should be taken according to package directions if there are no bowel movements after 48 hours.    5. Wash / shower every day  starting 2 days after surgery.  You may shower over the Steri-Strips as they are waterproof.    6. Remove your outer bandage 2 days after surgery.  Steri-Strips will peel off after about 1 week.  You may leave the incisions open to air.  You may replace a dressing/Band-Aid to cover an incision for comfort if you wish.  Continue to shower over incision(s)  after the dressing is off.  7. ACTIVITIES as tolerated:   a. You may resume regular (light) daily activities beginning the next day--such as daily self-care, walking, climbing stairs--gradually increasing activities as tolerated.  Control your pain so that you can walk an hour a day.  If you can walk 30 minutes without difficulty, it is safe to try more intense activity such as jogging, treadmill, bicycling, low-impact aerobics, swimming, etc. b. Refrain from the most intensive and strenuous activity such such as sit-ups, heavy lifting, contact sports, etc  Refrain from any heavy lifting or straining until at least 6 weeks after surgery.   c. DO NOT PUSH THROUGH PAIN.  Let pain be your guide: If it hurts to do something, don't do it.  Pain is your body warning you to avoid that activity for another week until the pain goes down. d. You may drive when you are no longer taking prescription pain medication, you can comfortably wear a seatbelt, and you can safely maneuver your car and apply brakes. e. Dennis Bast may have sexual intercourse when it is comfortable.   8. FOLLOW UP in our office a. Please call [facility] at [phone] to set up an appointment to see your surgeon in the office for a follow-up appointment approximately 2-3 weeks after your surgery. b. Make sure that you call for this appointment the day you arrive home to insure a convenient appointment time.  9.  If you have disability of FMLA / Family leave forms, please bring the forms to the office for processing.  (do not give to your surgeon).  WHEN TO CALL us [phone]: 1. Poor pain control 2. Reactions / problems with new medications (rash/itching, nausea, etc)  3. Fever over 101.5 F (38.5 C) 4. Inability to urinate 5. Nausea and/or vomiting 6. Worsening swelling or bruising 7. Continued bleeding from incision. 8. Increased pain, redness, or drainage from the incision   The clinic staff is available to answer your questions during  regular business hours (8:30am-5pm).  Please don't hesitate to call and ask to speak to one of our nurses for clinical concerns.   If you have a medical emergency, go to the nearest emergency room or call 911.  A surgeon from [facility] is always on call at the hospitals in [facility], [place], [place], [place], [place]  [address] ?  [address] [address], [place], [place]   [address] MAIN: [phone] ? TOLL FREE: [phone] ? FAX: [phone] [email or web address]

## 2019-09-12 NOTE — Op Note (Signed)
Operative Note  Jordan Beasley  [id number]  [id number]  09/12/2019   Surgeon: Vikki Ports A ConnorMD  Procedure performed: Open primary repair of chronically incarcerated umbilical hernia  Preop diagnosis: Incarcerated umbilical hernia Post-op diagnosis/intraop findings: Same  EBL: Minimal  Complications: none  Description of procedure: After obtaining informed consent the patient was taken to the operating room and placed supine on operating room table wheregeneral endotracheal anesthesia was initiated, preoperative antibiotics were administered, SCDs applied, and a formal timeout was performed.  The abdomen was prepped and draped in the usual sterile fashion.  After infiltration with local (0.25% Marcaine with epinephrine) a curvilinear incision was made just inferior to the umbilicus and the soft tissues dissected with cautery until the hernia sac and umbilical stalk were encountered.  The umbilical stalk was circumferentially dissected and then using cautery this was divided from the underlying hernia sac which consisted of preperitoneal fat.  This was excised and the fascial defect skeletonized.  The defect was approximately 63mm in diameter.  There was some fullness of the subcutaneous fat just superior to this.  I did dissect this out and encountered a very small supraumbilical defect containing more incarcerated preperitoneal fat, this defect was approximately 3 mm x 1 mm more transversely oriented and just superior to the umbilical defect.  The umbilical hernia fascia was reapproximated transversely with interrupted 0 Ethibonds and the small supraumbilical fascial defect was also closed with a simple interrupted 0 Ethibond.  Additional local was infiltrated in the abdominal wall and subcutaneous tissue.  Hemostasis was ensured in the wound.  The umbilical skin was secured back down to the fascia with 3-0 Vicryl.  The incision was then closed with running subcuticular Monocryl.  Benzoin,  Steri-Strips and a pressure dressing of gauze and Tegaderm were then applied.  The patient was then awakened, extubated and taken to PACU in stable condition.   All counts were correct at the completion of the case.

## 2019-09-12 NOTE — Transfer of Care (Signed)
Immediate Anesthesia Transfer of Care Note  Patient: Jordan Beasley  Procedure(s) Performed: UMBILICAL HERNIA REPAIR (N/A )  Patient Location: PACU  Anesthesia Type:General  Level of Consciousness: awake, alert , oriented and patient cooperative  Airway & Oxygen Therapy: Patient Spontanous Breathing and Patient connected to face mask oxygen  Post-op Assessment: Report given to RN and Post -op Vital signs reviewed and stable  Post vital signs: Reviewed and stable  Last Vitals:  Vitals Value Taken Time  BP    Temp    Pulse    Resp    SpO2      Last Pain:  Vitals:   09/12/19 1259  TempSrc:   PainSc: 0-No pain      Patients Stated Pain Goal: 4 ([id number])  Complications: No apparent anesthesia complications

## 2019-09-12 NOTE — Interval H&P Note (Signed)
History and Physical Interval Note:  09/12/2019 1:08 PM  Jordan Beasley  has presented today for surgery, with the diagnosis of INCARCERATED UMBILICAL HERNIA.  The various methods of treatment have been discussed with the patient and family. After consideration of risks, benefits and other options for treatment, the patient has consented to  Procedure(s): UMBILICAL HERNIA REPAIR (N/A) as a surgical intervention.  The patient's history has been reviewed, patient examined, no change in status, stable for surgery.  I have reviewed the patient's chart and labs.  Questions were answered to the patient's satisfaction.     [name]

## 2020-05-24 IMAGING — DX DG CHEST 2V
2 series · 2 of 2 positions shown · non-contrast
Comparison: August 06, 2014

CLINICAL DATA: Shortness of Breath

EXAM:
CHEST - 2 VIEW

[chest pa]
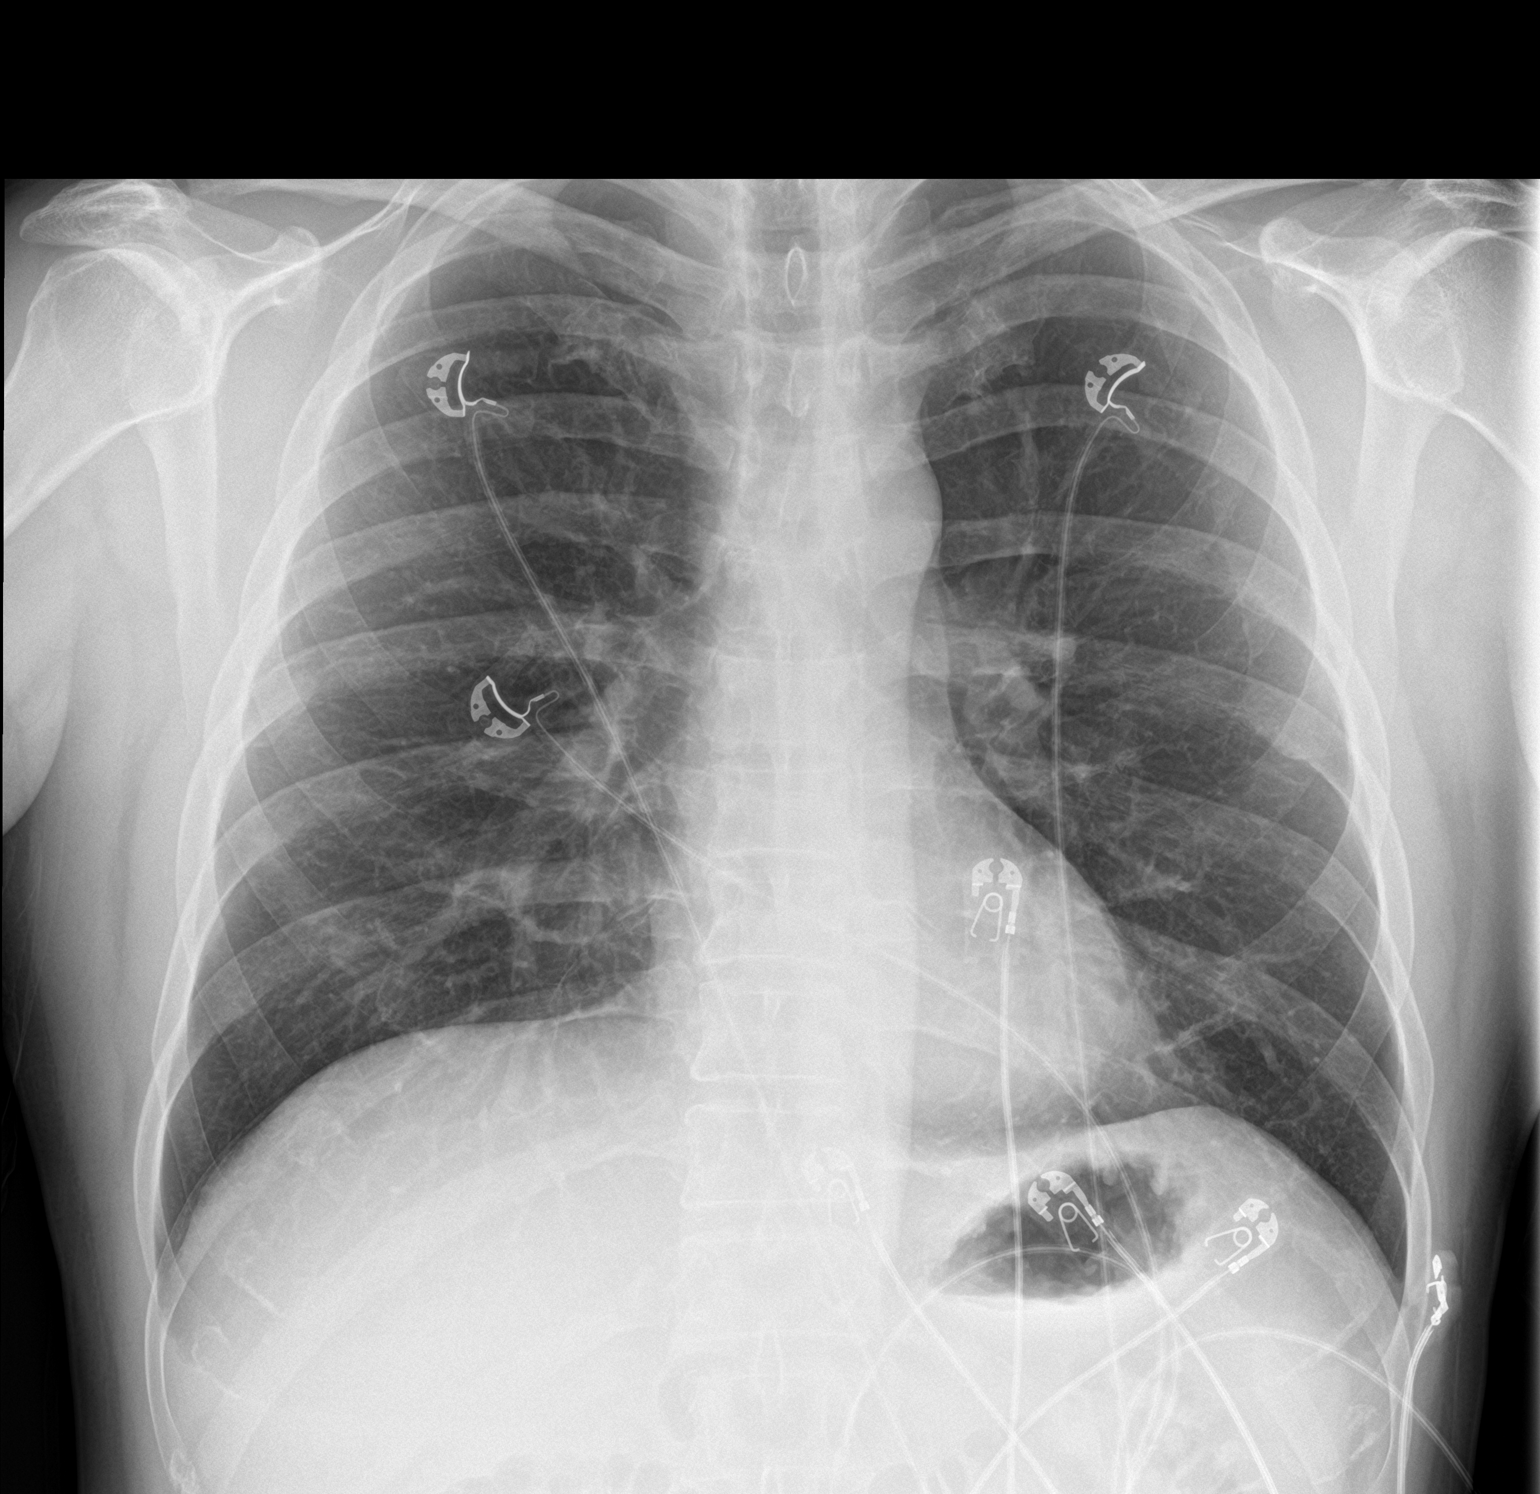

[chest lat]
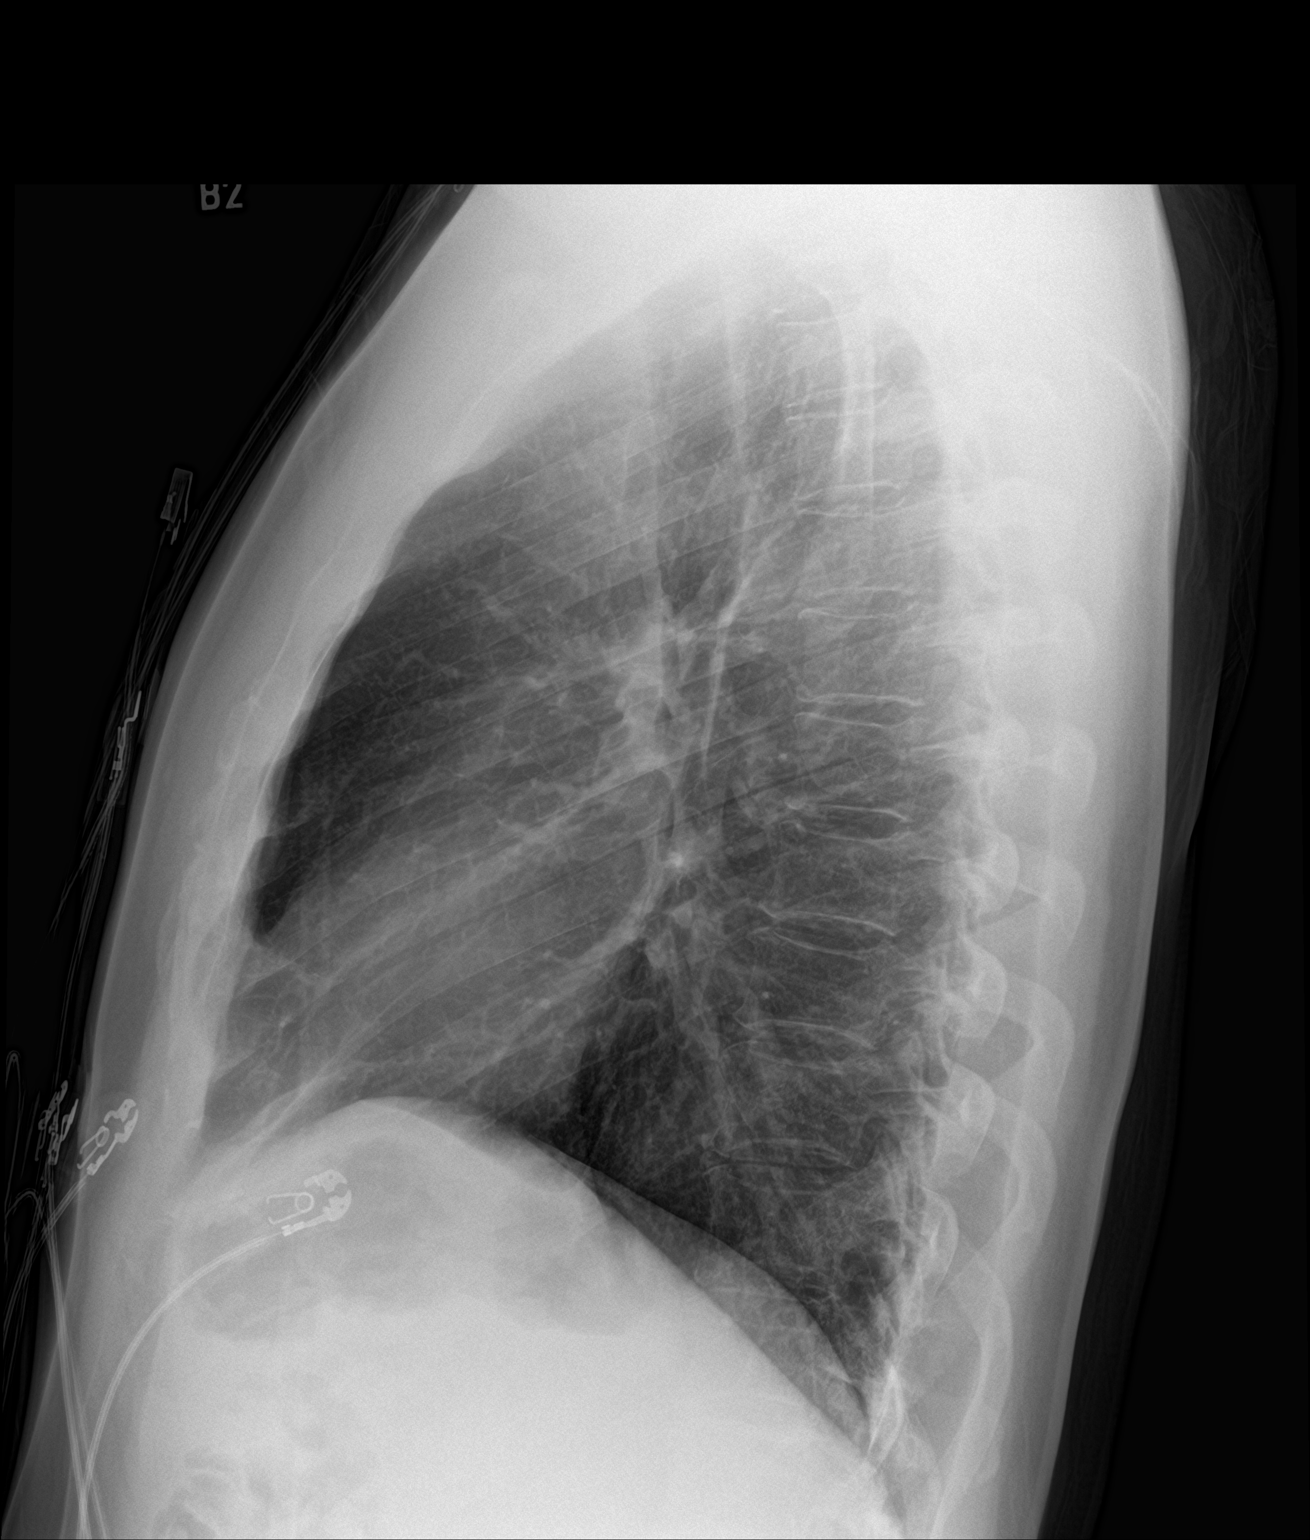

[2 of 2 positions shown; findings below may reference images not displayed]

FINDINGS: Lungs are clear. The heart size and pulmonary vascularity are
normal. No adenopathy. No pneumothorax. No bone lesions.
IMPRESSION: No edema or consolidation.

## 2020-08-10 ENCOUNTER — Other Ambulatory Visit: Payer: Self-pay | Admitting: Adult Health

## 2020-08-10 DIAGNOSIS — I10 Essential (primary) hypertension: Secondary | ICD-10-CM

## 2020-08-11 ENCOUNTER — Other Ambulatory Visit: Payer: Self-pay

## 2020-08-12 ENCOUNTER — Encounter: Payer: Self-pay | Admitting: Adult Health

## 2020-08-12 ENCOUNTER — Ambulatory Visit (INDEPENDENT_AMBULATORY_CARE_PROVIDER_SITE_OTHER): Payer: Commercial Managed Care - PPO | Admitting: Adult Health

## 2020-08-12 ENCOUNTER — Other Ambulatory Visit: Payer: Self-pay | Admitting: Adult Health

## 2020-08-12 VITALS — BP 142/90 | HR 69 | Temp 98.1°F | Ht 70.75 in | Wt 179.6 lb

## 2020-08-12 DIAGNOSIS — T7840XA Allergy, unspecified, initial encounter: Secondary | ICD-10-CM

## 2020-08-12 DIAGNOSIS — I10 Essential (primary) hypertension: Secondary | ICD-10-CM | POA: Diagnosis not present

## 2020-08-12 DIAGNOSIS — Z0000 Encounter for general adult medical examination without abnormal findings: Secondary | ICD-10-CM

## 2020-08-12 DIAGNOSIS — Z125 Encounter for screening for malignant neoplasm of prostate: Secondary | ICD-10-CM

## 2020-08-12 DIAGNOSIS — E782 Mixed hyperlipidemia: Secondary | ICD-10-CM | POA: Diagnosis not present

## 2020-08-12 DIAGNOSIS — Z1211 Encounter for screening for malignant neoplasm of colon: Secondary | ICD-10-CM | POA: Diagnosis not present

## 2020-08-12 DIAGNOSIS — Z1159 Encounter for screening for other viral diseases: Secondary | ICD-10-CM

## 2020-08-12 LAB — COMPREHENSIVE METABOLIC PANEL
ALT: 22 U/L (ref 0–53)
AST: 21 U/L (ref 0–37)
Albumin: 4.4 g/dL (ref 3.5–5.2)
Alkaline Phosphatase: 53 U/L (ref 39–117)
BUN: 11 mg/dL (ref 6–23)
CO2: 26 mEq/L (ref 19–32)
Calcium: 9.1 mg/dL (ref 8.4–10.5)
Chloride: 101 mEq/L (ref 96–112)
Creatinine, Ser: 0.85 mg/dL (ref 0.40–1.50)
GFR: 102.34 mL/min (ref >60.00)
Glucose, Bld: 110 mg/dL — ABNORMAL HIGH (ref 70–99)
Potassium: 4.1 mEq/L (ref 3.5–5.1)
Sodium: 140 mEq/L (ref 135–145)
Total Bilirubin: 0.8 mg/dL (ref 0.2–1.2)
Total Protein: 7 g/dL (ref 6.0–8.3)

## 2020-08-12 LAB — CBC WITH DIFFERENTIAL/PLATELET
Basophils Absolute: 0 10*3/uL (ref 0.0–0.1)
Basophils Relative: 0.7 % (ref 0.0–3.0)
Eosinophils Absolute: 0.2 10*3/uL (ref 0.0–0.7)
Eosinophils Relative: 3.9 % (ref 0.0–5.0)
HCT: 45.5 % (ref 39.0–52.0)
Hemoglobin: 15.4 g/dL (ref 13.0–17.0)
Lymphocytes Relative: 17.5 % (ref 12.0–46.0)
Lymphs Abs: 0.9 10*3/uL (ref 0.7–4.0)
MCHC: 33.9 g/dL (ref 30.0–36.0)
MCV: 88.7 fl (ref 78.0–100.0)
Monocytes Absolute: 0.4 10*3/uL (ref 0.1–1.0)
Monocytes Relative: 7.4 % (ref 3.0–12.0)
Neutro Abs: 3.8 10*3/uL (ref 1.4–7.7)
Neutrophils Relative %: 70.5 % (ref 43.0–77.0)
Platelets: 201 10*3/uL (ref 150.0–400.0)
RBC: 5.12 Mil/uL (ref 4.22–5.81)
RDW: 13 % (ref 11.5–15.5)
WBC: 5.3 10*3/uL (ref 4.0–10.5)

## 2020-08-12 LAB — LIPID PANEL
Cholesterol: 181 mg/dL (ref 0–200)
HDL: 54.5 mg/dL (ref >39.00)
LDL Cholesterol: 112 mg/dL — ABNORMAL HIGH (ref 0–99)
NonHDL: 126.3
Total CHOL/HDL Ratio: 3
Triglycerides: 72 mg/dL (ref 0.0–149.0)
VLDL: 14.4 mg/dL (ref 0.0–40.0)

## 2020-08-12 LAB — PSA: PSA: 0.78 ng/mL (ref 0.10–4.00)

## 2020-08-12 LAB — TSH: TSH: 2.19 u[IU]/mL (ref 0.35–4.50)

## 2020-08-12 MED ORDER — OLMESARTAN MEDOXOMIL 20 MG PO TABS: 20.0000 mg | ORAL_TABLET | Freq: Every day | ORAL | 0 refills | Status: DC

## 2020-08-12 MED ORDER — SIMVASTATIN 20 MG PO TABS: 20.0000 mg | ORAL_TABLET | Freq: Every day | ORAL | 3 refills | Status: DC

## 2020-08-12 MED ORDER — PREDNISONE 20 MG PO TABS: 20.0000 mg | ORAL_TABLET | Freq: Every day | ORAL | 0 refills | Status: DC

## 2020-08-12 NOTE — Progress Notes (Signed)
Subjective:    Patient ID: Jordan Beasley, male    DOB: March 28, 1971, 50 y.o.   MRN: [id number]  HPI  Patient presents for yearly preventative medicine examination. He is a pleasant 50 year old male who  has a past medical history of Cancer ([place]) (66/59/93), Complication of anesthesia, History of radiation therapy (06/16/10 to 07/08/10), Hypertension, and Seminoma of testis ([place]).  Essential hypertension-is currently prescribed Norvasc 5 mg and Diovan 160 mg daily.  He denies dizziness, lightheadedness, chest pain, or shortness of breath. He does not like the way Diovan makes him feel ( sluggish). He does check his BP at home periodically and reports that his BP at home has been in the 120's/80 BP Readings from Last 3 Encounters:  08/12/20 (!) 142/90  09/12/19 (!) 145/99  09/10/19 (!) 155/86   Hyperlipidemia-prescribed simvastatin 10 mg.  He denies myalgia or fatigue Lab Results  Component Value Date   CHOL 207 (H) 07/02/2019   HDL 50.90 07/02/2019   LDLCALC 140 (H) 07/02/2019   LDLDIRECT 150.9 07/19/2011   TRIG 81.0 07/02/2019   CHOLHDL 4 07/02/2019   GERD-controlled with Prilosec  Allergic Reaction -started yesterday, localized to his face.  Ports that he was working in the yard yesterday with fertilizer on grass seed which may be the culprit.  Has been using Benadryl to help as an anti-inflammatory.  Does report improvement today but face continues to be red, slightly itchy and have swelling under his eyes.  He denies shortness of breath or feeling as though his throat is closing  All immunizations and health maintenance protocols were reviewed with the patient and needed orders were placed.  Appropriate screening laboratory values were ordered for the patient including screening of hyperlipidemia, renal function and hepatic function. If indicated by BPH, a PSA was ordered.  Medication reconciliation,  past medical history, social history, problem list and allergies were reviewed in  detail with the patient  Goals were established with regard to weight loss, exercise, and  diet in compliance with medications. He has been running 4-5 days a week, 3 miles a time and has been able to lose about 10 pounds. He tries to eat healthy   Wt Readings from Last 10 Encounters:  08/12/20 179 lb 9.6 oz (81.5 kg)  09/12/19 184 lb 15.5 oz (83.9 kg)  09/10/19 188 lb (85.3 kg)  09/10/19 192 lb (87.1 kg)  07/02/19 190 lb (86.2 kg)  05/09/18 185 lb (83.9 kg)  04/23/18 185 lb (83.9 kg)  04/18/18 190 lb (86.2 kg)  03/14/16 190 lb 6.4 oz (86.4 kg)  10/13/14 180 lb (81.6 kg)    He is due for routine colon cancer screening   Review of Systems  Constitutional: Negative.   HENT: Negative.   Eyes: Negative.   Respiratory: Negative.   Cardiovascular: Negative.   Gastrointestinal: Negative.   Endocrine: Negative.   Genitourinary: Negative.   Musculoskeletal: Negative.   Skin: Positive for rash.  Allergic/Immunologic: Negative.   Neurological: Negative.   Hematological: Negative.   Psychiatric/Behavioral: Negative.   All other systems reviewed and are negative.  Past Medical History:  Diagnosis Date  . Cancer ([place]) 05/19/10   seminoma L testicle  . Complication of anesthesia    Last time took longer to wake up after anesthesia  . History of radiation therapy 06/16/10 to 07/08/10   paraaortic region  . Hypertension   . Seminoma of testis [facility])     Social History   Socioeconomic History  .  Marital status: Married    Spouse name: Not on file  . Number of children: 3  . Years of education: Not on file  . Highest education level: Not on file  Occupational History  . Occupation: Scientist, clinical (histocompatibility and immunogenetics): [employer]  Tobacco Use  . Smoking status: Never Smoker  . Smokeless tobacco: Never Used  Vaping Use  . Vaping Use: Never used  Substance and Sexual Activity  . Alcohol use: Yes    Comment: occassional  . Drug use: No  . Sexual activity: Not on file  Other Topics  Concern  . Not on file  Social History Narrative   Married   Works in Press photographer      He likes to go to [employer] and do dirt bikes with his son.    Social Determinants of Health   Financial Resource Strain: Not on file  Food Insecurity: Not on file  Transportation Needs: Not on file  Physical Activity: Not on file  Stress: Not on file  Social Connections: Not on file  Intimate Partner Violence: Not on file    Past Surgical History:  Procedure Laterality Date  . EYE SURGERY     age 3  . ORCHIECTOMY  05/19/2010   left inguinal orchiectomy  . TESTICULAR EXPLORATION Left 05/19/2010  . UMBILICAL HERNIA REPAIR N/A 09/12/2019   Procedure: UMBILICAL HERNIA REPAIR;  Surgeon: Clovis Riley, MD;  Location: [facility];  Service: General;  Laterality: N/A;    Family History  Problem Relation Age of Onset  . Hypertension Mother   . Hypertension Father   . Diabetes Mellitus II Father   . Ovarian cancer Maternal Grandmother        ovarian  . Diabetes Maternal Grandfather   . Stroke Maternal Grandfather   . Colon cancer Neg Hx   . Breast cancer Neg Hx     No Known Allergies  Current Outpatient Medications on File Prior to Visit  Medication Sig Dispense Refill  . amLODipine (NORVASC) 5 MG tablet TAKE 1 TABLET(5 MG) BY MOUTH DAILY 90 tablet 3  . omeprazole (PRILOSEC) 20 MG capsule TAKE 1 CAPSULE(20 MG) BY MOUTH DAILY 90 capsule 3  . Probiotic Product (PROBIOTIC PO) Take by mouth.    . valsartan (DIOVAN) 160 MG tablet TAKE 1 TABLET BY MOUTH EVERY DAY (Patient taking differently: Take 160 mg by mouth daily.) 90 tablet 3  . simvastatin (ZOCOR) 10 MG tablet Take 1 tablet (10 mg total) by mouth at bedtime. 90 tablet 3   No current facility-administered medications on file prior to visit.    BP (!) 142/90 (BP Location: Left Arm, Patient Position: Sitting, Cuff Size: Normal)   Pulse 69   Temp 98.1 F (36.7 C) (Oral)   Ht 5' 10.75" (1.797 m)   Wt 179 lb 9.6 oz (81.5 kg)   SpO2  98%   BMI 25.23 kg/m       Objective:   Physical Exam Vitals and nursing note reviewed.  Constitutional:      General: He is not in acute distress.    Appearance: Normal appearance. He is well-developed and normal weight.  HENT:     Head: Normocephalic and atraumatic. Right periorbital erythema and left periorbital erythema present.     Comments: Mild edema under both eyes     Right Ear: Tympanic membrane, ear canal and external ear normal. There is no impacted cerumen.     Left Ear: Tympanic membrane, ear canal and external  ear normal. There is no impacted cerumen.     Nose: Nose normal. No congestion or rhinorrhea.     Mouth/Throat:     Mouth: Mucous membranes are moist.     Pharynx: Oropharynx is clear. No oropharyngeal exudate or posterior oropharyngeal erythema.  Eyes:     General:        Right eye: No discharge.        Left eye: No discharge.     Extraocular Movements: Extraocular movements intact.     Conjunctiva/sclera: Conjunctivae normal.     Pupils: Pupils are equal, round, and reactive to light.  Neck:     Vascular: No carotid bruit.     Trachea: No tracheal deviation.  Cardiovascular:     Rate and Rhythm: Normal rate and regular rhythm.     Pulses: Normal pulses.     Heart sounds: Normal heart sounds. No murmur heard. No friction rub. No gallop.   Pulmonary:     Effort: Pulmonary effort is normal. No respiratory distress.     Breath sounds: Normal breath sounds. No stridor. No wheezing, rhonchi or rales.  Chest:     Chest wall: No tenderness.  Abdominal:     General: Bowel sounds are normal. There is no distension.     Palpations: Abdomen is soft. There is no mass.     Tenderness: There is no abdominal tenderness. There is no right CVA tenderness, left CVA tenderness, guarding or rebound.     Hernia: No hernia is present.  Musculoskeletal:        General: No swelling, tenderness, deformity or signs of injury. Normal range of motion.     Right lower leg:  No edema.     Left lower leg: No edema.  Lymphadenopathy:     Cervical: No cervical adenopathy.  Skin:    General: Skin is warm and dry.     Capillary Refill: Capillary refill takes less than 2 seconds.     Coloration: Skin is not jaundiced or pale.     Findings: No bruising, erythema, lesion or rash.  Neurological:     General: No focal deficit present.     Mental Status: He is alert and oriented to person, place, and time.     Cranial Nerves: No cranial nerve deficit.     Sensory: No sensory deficit.     Motor: No weakness.     Coordination: Coordination normal.     Gait: Gait normal.     Deep Tendon Reflexes: Reflexes normal.  Psychiatric:        Mood and Affect: Mood normal.        Behavior: Behavior normal.        Thought Content: Thought content normal.        Judgment: Judgment normal.       Assessment & Plan:  1. Routine general medical examination at a health care facility -Is made significant lifestyle modifications, he was congratulated on doing so.  Advise follow-up in 1 year or sooner if needed - CBC with Differential/Platelet; Future - Comprehensive metabolic panel; Future - Lipid panel; Future - TSH; Future - CBC with Differential/Platelet - Comprehensive metabolic panel - TSH - Lipid panel  2. Essential hypertension -We will switch from valsartan to Benicar.  He was advised to monitor his blood pressures at home more closely.  Send me his results via MyChart in 2 weeks - CBC with Differential/Platelet; Future - Comprehensive metabolic panel; Future - Lipid panel; Future - TSH; Future -  olmesartan (BENICAR) 20 MG tablet; Take 1 tablet (20 mg total) by mouth daily.  Dispense: 90 tablet; Refill: 0 - CBC with Differential/Platelet - Comprehensive metabolic panel - TSH - Lipid panel  3. Mixed hyperlipidemia - Consider increase in statin  - CBC with Differential/Platelet; Future - Comprehensive metabolic panel; Future - Lipid panel; Future - TSH;  Future - CBC with Differential/Platelet - Comprehensive metabolic panel - TSH - Lipid panel  4. Colon cancer screening  - Ambulatory referral to Gastroenterology  5. Prostate cancer screening  - PSA; Future - PSA  6. Need for hepatitis C screening test  - Hep C Antibody; Future - Hep C Antibody  7. Allergic reaction, initial encounter - Return precautions reviewed - predniSONE (DELTASONE) 20 MG tablet; Take 1 tablet (20 mg total) by mouth daily with breakfast.  Dispense: 7 tablet; Refill: 0

## 2020-08-13 LAB — HEPATITIS C ANTIBODY
Hepatitis C Ab: NONREACTIVE
SIGNAL TO CUT-OFF: 0.01 (ref <1.00)

## 2020-10-07 ENCOUNTER — Other Ambulatory Visit: Payer: Self-pay | Admitting: Adult Health

## 2020-11-08 ENCOUNTER — Other Ambulatory Visit: Payer: Self-pay | Admitting: Adult Health

## 2020-11-08 DIAGNOSIS — I10 Essential (primary) hypertension: Secondary | ICD-10-CM

## 2021-02-04 ENCOUNTER — Other Ambulatory Visit: Payer: Self-pay | Admitting: Adult Health

## 2021-02-04 DIAGNOSIS — I10 Essential (primary) hypertension: Secondary | ICD-10-CM

## 2021-04-25 ENCOUNTER — Other Ambulatory Visit: Payer: Self-pay | Admitting: Adult Health

## 2021-04-29 ENCOUNTER — Other Ambulatory Visit: Payer: Self-pay | Admitting: Adult Health

## 2021-05-06 ENCOUNTER — Other Ambulatory Visit: Payer: Self-pay | Admitting: Adult Health

## 2021-05-06 DIAGNOSIS — I10 Essential (primary) hypertension: Secondary | ICD-10-CM

## 2021-08-02 ENCOUNTER — Other Ambulatory Visit: Payer: Self-pay | Admitting: Adult Health

## 2021-08-02 DIAGNOSIS — I10 Essential (primary) hypertension: Secondary | ICD-10-CM

## 2021-08-02 NOTE — Telephone Encounter (Signed)
Patient need to schedule an ov for more refills. 

## 2021-08-08 ENCOUNTER — Other Ambulatory Visit: Payer: Self-pay | Admitting: Adult Health

## 2021-08-08 DIAGNOSIS — I10 Essential (primary) hypertension: Secondary | ICD-10-CM

## 2021-08-16 ENCOUNTER — Other Ambulatory Visit: Payer: Self-pay | Admitting: Adult Health

## 2021-08-16 ENCOUNTER — Encounter: Payer: Self-pay | Admitting: Adult Health

## 2021-08-16 ENCOUNTER — Ambulatory Visit (INDEPENDENT_AMBULATORY_CARE_PROVIDER_SITE_OTHER): Payer: Commercial Managed Care - PPO | Admitting: Adult Health

## 2021-08-16 VITALS — BP 120/84 | HR 94 | Temp 98.5°F | Ht 70.5 in | Wt 185.0 lb

## 2021-08-16 DIAGNOSIS — Z114 Encounter for screening for human immunodeficiency virus [HIV]: Secondary | ICD-10-CM

## 2021-08-16 DIAGNOSIS — Z125 Encounter for screening for malignant neoplasm of prostate: Secondary | ICD-10-CM

## 2021-08-16 DIAGNOSIS — I10 Essential (primary) hypertension: Secondary | ICD-10-CM

## 2021-08-16 DIAGNOSIS — Z1211 Encounter for screening for malignant neoplasm of colon: Secondary | ICD-10-CM | POA: Diagnosis not present

## 2021-08-16 DIAGNOSIS — E782 Mixed hyperlipidemia: Secondary | ICD-10-CM | POA: Diagnosis not present

## 2021-08-16 DIAGNOSIS — K219 Gastro-esophageal reflux disease without esophagitis: Secondary | ICD-10-CM

## 2021-08-16 DIAGNOSIS — Z0000 Encounter for general adult medical examination without abnormal findings: Secondary | ICD-10-CM

## 2021-08-16 LAB — CBC WITH DIFFERENTIAL/PLATELET
Basophils Absolute: 0 10*3/uL (ref 0.0–0.1)
Basophils Relative: 1.1 % (ref 0.0–3.0)
Eosinophils Absolute: 0.1 10*3/uL (ref 0.0–0.7)
Eosinophils Relative: 1.8 % (ref 0.0–5.0)
HCT: 45.5 % (ref 39.0–52.0)
Hemoglobin: 15.3 g/dL (ref 13.0–17.0)
Lymphocytes Relative: 26 % (ref 12.0–46.0)
Lymphs Abs: 1.2 10*3/uL (ref 0.7–4.0)
MCHC: 33.5 g/dL (ref 30.0–36.0)
MCV: 90.1 fl (ref 78.0–100.0)
Monocytes Absolute: 0.4 10*3/uL (ref 0.1–1.0)
Monocytes Relative: 7.6 % (ref 3.0–12.0)
Neutro Abs: 3 10*3/uL (ref 1.4–7.7)
Neutrophils Relative %: 63.5 % (ref 43.0–77.0)
Platelets: 201 10*3/uL (ref 150.0–400.0)
RBC: 5.05 Mil/uL (ref 4.22–5.81)
RDW: 12.7 % (ref 11.5–15.5)
WBC: 4.7 10*3/uL (ref 4.0–10.5)

## 2021-08-16 LAB — PSA: PSA: 1.07 ng/mL (ref 0.10–4.00)

## 2021-08-16 LAB — COMPREHENSIVE METABOLIC PANEL
ALT: 20 U/L (ref 0–53)
AST: 19 U/L (ref 0–37)
Albumin: 4.7 g/dL (ref 3.5–5.2)
Alkaline Phosphatase: 57 U/L (ref 39–117)
BUN: 13 mg/dL (ref 6–23)
CO2: 28 mEq/L (ref 19–32)
Calcium: 9.3 mg/dL (ref 8.4–10.5)
Chloride: 102 mEq/L (ref 96–112)
Creatinine, Ser: 0.9 mg/dL (ref 0.40–1.50)
GFR: 99.88 mL/min (ref >60.00)
Glucose, Bld: 108 mg/dL — ABNORMAL HIGH (ref 70–99)
Potassium: 4.3 mEq/L (ref 3.5–5.1)
Sodium: 139 mEq/L (ref 135–145)
Total Bilirubin: 0.4 mg/dL (ref 0.2–1.2)
Total Protein: 7.3 g/dL (ref 6.0–8.3)

## 2021-08-16 LAB — LIPID PANEL
Cholesterol: 165 mg/dL (ref 0–200)
HDL: 51 mg/dL (ref >39.00)
LDL Cholesterol: 99 mg/dL (ref 0–99)
NonHDL: 114.37
Total CHOL/HDL Ratio: 3
Triglycerides: 76 mg/dL (ref 0.0–149.0)
VLDL: 15.2 mg/dL (ref 0.0–40.0)

## 2021-08-16 LAB — TSH: TSH: 1.76 u[IU]/mL (ref 0.35–5.50)

## 2021-08-16 MED ORDER — SIMVASTATIN 20 MG PO TABS: 20.0000 mg | ORAL_TABLET | Freq: Every day | ORAL | 3 refills | Status: AC

## 2021-08-16 MED ORDER — AMLODIPINE BESYLATE 5 MG PO TABS: ORAL_TABLET | ORAL | 3 refills | Status: DC

## 2021-08-16 MED ORDER — OMEPRAZOLE 20 MG PO CPDR: DELAYED_RELEASE_CAPSULE | ORAL | 3 refills | Status: AC

## 2021-08-16 MED ORDER — OLMESARTAN MEDOXOMIL 20 MG PO TABS: 20.0000 mg | ORAL_TABLET | Freq: Every day | ORAL | 3 refills | Status: DC

## 2021-08-16 NOTE — Progress Notes (Signed)
? ?Subjective:  ? ? Patient ID: Jordan Beasley, male    DOB: 07-Jan-1971, 51 y.o.   MRN: [id number] ? ?HPI ?Patient presents for yearly preventative medicine examination. He is a pleasant 51 year old male who  has a past medical history of Cancer ([place]) (95/62/13), Complication of anesthesia, History of radiation therapy (06/16/10 to 07/08/10), Hypertension, and Seminoma of testis ([employer]). ? ?Hypertension-currently managed with Norvasc 5 mg daily and Benicar 20 mg  He denies dizziness, lightheadedness, chest pain, or shortness of breath. He does monitor his BP at home with with readings in the 120's/70's.  ?BP Readings from Last 3 Encounters:  ?08/16/21 120/84  ?08/12/20 (!) 142/90  ?09/12/19 (!) 145/99  ? ?Hyperlipidemia prescribed simvastatin 20 mg daily.  He denies myalgia or fatigue ?Lab Results  ?Component Value Date  ? CHOL 181 08/12/2020  ? HDL 54.50 08/12/2020  ? LDLCALC 112 (H) 08/12/2020  ? LDLDIRECT 150.9 07/19/2011  ? TRIG 72.0 08/12/2020  ? CHOLHDL 3 08/12/2020  ? ?GERD-controlled with Prilosec ? ?All immunizations and health maintenance protocols were reviewed with the patient and needed orders were placed. ? ?Appropriate screening laboratory values were ordered for the patient including screening of hyperlipidemia, renal function and hepatic function. ?If indicated by BPH, a PSA was ordered. ? ?Medication reconciliation,  past medical history, social history, problem list and allergies were reviewed in detail with the patient ? ?Goals were established with regard to weight loss, exercise, and  diet in compliance with medications. He eats healthy but has not been doing much exercise this year.  ? ?Wt Readings from Last 3 Encounters:  ?08/16/21 185 lb (83.9 kg)  ?08/12/20 179 lb 9.6 oz (81.5 kg)  ?09/12/19 184 lb 15.5 oz (83.9 kg)  ? ?Review of Systems  ?Constitutional: Negative.   ?HENT: Negative.    ?Eyes: Negative.   ?Respiratory: Negative.    ?Cardiovascular: Negative.   ?Gastrointestinal: Negative.    ?Endocrine: Negative.   ?Genitourinary: Negative.   ?Musculoskeletal: Negative.   ?Skin: Negative.   ?Allergic/Immunologic: Negative.   ?Neurological: Negative.   ?Hematological: Negative.   ?Psychiatric/Behavioral: Negative.    ?All other systems reviewed and are negative. ? ?Past Medical History:  ?Diagnosis Date  ? Cancer ([place]) 05/19/10  ? seminoma L testicle  ? Complication of anesthesia   ? Last time took longer to wake up after anesthesia  ? History of radiation therapy 06/16/10 to 07/08/10  ? paraaortic region  ? Hypertension   ? Seminoma of testis ([place])   ? ? ?Social History  ? ?Socioeconomic History  ? Marital status: Married  ?  Spouse name: Not on file  ? Number of children: 3  ? Years of education: Not on file  ? Highest education level: Not on file  ?Occupational History  ? Occupation: Press photographer  ?  Employer: [employer]  ?Tobacco Use  ? Smoking status: Never  ? Smokeless tobacco: Never  ?Vaping Use  ? Vaping Use: Never used  ?Substance and Sexual Activity  ? Alcohol use: Yes  ?  Comment: occassional  ? Drug use: No  ? Sexual activity: Not on file  ?Other Topics Concern  ? Not on file  ?Social History Narrative  ? Married  ? Works in Press photographer  ?   ? He likes to go to [employer] and do dirt bikes with his son.   ? ?Social Determinants of Health  ? ?Financial Resource Strain: Not on file  ?Food Insecurity: Not on file  ?Transportation  Needs: Not on file  ?Physical Activity: Not on file  ?Stress: Not on file  ?Social Connections: Not on file  ?Intimate Partner Violence: Not on file  ? ? ?Past Surgical History:  ?Procedure Laterality Date  ? EYE SURGERY    ? age 73  ? ORCHIECTOMY  05/19/2010  ? left inguinal orchiectomy  ? TESTICULAR EXPLORATION Left 05/19/2010  ? UMBILICAL HERNIA REPAIR N/A 09/12/2019  ? Procedure: UMBILICAL HERNIA REPAIR;  Surgeon: Clovis Riley, MD;  Location: [facility];  Service: General;  Laterality: N/A;  ? ? ?Family History  ?Problem Relation Age of Onset  ? Hypertension Mother    ? Hypertension Father   ? Diabetes Mellitus II Father   ? Ovarian cancer Maternal Grandmother   ?     ovarian  ? Diabetes Maternal Grandfather   ? Stroke Maternal Grandfather   ? Colon cancer Neg Hx   ? Breast cancer Neg Hx   ? ? ?No Known Allergies ? ?Current Outpatient Medications on File Prior to Visit  ?Medication Sig Dispense Refill  ? amLODipine (NORVASC) 5 MG tablet TAKE 1 TABLET(5 MG) BY MOUTH DAILY 90 tablet 3  ? olmesartan (BENICAR) 20 MG tablet TAKE 1 TABLET BY MOUTH EVERY DAY 30 tablet 0  ? omeprazole (PRILOSEC) 20 MG capsule TAKE 1 CAPSULE(20 MG) BY MOUTH DAILY 90 capsule 3  ? Probiotic Product (PROBIOTIC PO) Take by mouth.    ? simvastatin (ZOCOR) 20 MG tablet Take 1 tablet (20 mg total) by mouth at bedtime. 90 tablet 3  ? ?No current facility-administered medications on file prior to visit.  ? ? ?BP 120/84   Pulse 94   Temp 98.5 ?F (36.9 ?C) (Oral)   Ht 5' 10.5" (1.791 m)   Wt 185 lb (83.9 kg)   SpO2 98%   BMI 26.17 kg/m?  ? ? ? ?   ?Objective:  ? Physical Exam ?Vitals and nursing note reviewed.  ?Constitutional:   ?   General: He is not in acute distress. ?   Appearance: Normal appearance. He is well-developed and normal weight.  ?HENT:  ?   Head: Normocephalic and atraumatic.  ?   Right Ear: Tympanic membrane, ear canal and external ear normal. There is no impacted cerumen.  ?   Left Ear: Tympanic membrane, ear canal and external ear normal. There is no impacted cerumen.  ?   Nose: Nose normal. No congestion or rhinorrhea.  ?   Mouth/Throat:  ?   Mouth: Mucous membranes are moist.  ?   Pharynx: Oropharynx is clear. No oropharyngeal exudate or posterior oropharyngeal erythema.  ?Eyes:  ?   General:     ?   Right eye: No discharge.     ?   Left eye: No discharge.  ?   Extraocular Movements: Extraocular movements intact.  ?   Conjunctiva/sclera: Conjunctivae normal.  ?   Pupils: Pupils are equal, round, and reactive to light.  ?Neck:  ?   Vascular: No carotid bruit.  ?   Trachea: No tracheal  deviation.  ?Cardiovascular:  ?   Rate and Rhythm: Normal rate and regular rhythm.  ?   Pulses: Normal pulses.  ?   Heart sounds: Normal heart sounds. No murmur heard. ?  No friction rub. No gallop.  ?Pulmonary:  ?   Effort: Pulmonary effort is normal. No respiratory distress.  ?   Breath sounds: Normal breath sounds. No stridor. No wheezing, rhonchi or rales.  ?Chest:  ?   Chest  wall: No tenderness.  ?Abdominal:  ?   General: Bowel sounds are normal. There is no distension.  ?   Palpations: Abdomen is soft. There is no mass.  ?   Tenderness: There is no abdominal tenderness. There is no right CVA tenderness, left CVA tenderness, guarding or rebound.  ?   Hernia: No hernia is present.  ?Musculoskeletal:     ?   General: No swelling, tenderness, deformity or signs of injury. Normal range of motion.  ?   Right lower leg: No edema.  ?   Left lower leg: No edema.  ?Lymphadenopathy:  ?   Cervical: No cervical adenopathy.  ?Skin: ?   General: Skin is warm and dry.  ?   Capillary Refill: Capillary refill takes less than 2 seconds.  ?   Coloration: Skin is not jaundiced or pale.  ?   Findings: No bruising, erythema, lesion or rash.  ?Neurological:  ?   General: No focal deficit present.  ?   Mental Status: He is alert and oriented to person, place, and time.  ?   Cranial Nerves: No cranial nerve deficit.  ?   Sensory: No sensory deficit.  ?   Motor: No weakness.  ?   Coordination: Coordination normal.  ?   Gait: Gait normal.  ?   Deep Tendon Reflexes: Reflexes normal.  ?Psychiatric:     ?   Mood and Affect: Mood normal.     ?   Behavior: Behavior normal.     ?   Thought Content: Thought content normal.     ?   Judgment: Judgment normal.  ? ?   ?Assessment & Plan:  ?1. Routine general medical examination at a health care facility ?- Encouraged exercise ?- Follow up in one year or sooner if needed ?- CBC with Differential/Platelet; Future ?- Comprehensive metabolic panel; Future ?- Lipid panel; Future ?- TSH; Future ? ?2.  Essential hypertension ?- Well controlled.  ?- No change in medications  ?- CBC with Differential/Platelet; Future ?- Comprehensive metabolic panel; Future ?- Lipid panel; Future ?- TSH; Future ? ?3. Mixed hyperlipidemi

## 2021-08-16 NOTE — Patient Instructions (Signed)
It was great seeing you today   We will follow up with you regarding your lab work   Please let me know if you need anything   

## 2021-08-17 LAB — HIV ANTIBODY (ROUTINE TESTING W REFLEX): HIV 1&2 Ab, 4th Generation: NONREACTIVE

## 2021-08-30 ENCOUNTER — Other Ambulatory Visit: Payer: Self-pay | Admitting: Adult Health

## 2021-08-30 DIAGNOSIS — I10 Essential (primary) hypertension: Secondary | ICD-10-CM

## 2021-09-01 ENCOUNTER — Other Ambulatory Visit: Payer: Self-pay | Admitting: Adult Health

## 2021-09-01 DIAGNOSIS — I10 Essential (primary) hypertension: Secondary | ICD-10-CM

## 2021-09-23 ENCOUNTER — Other Ambulatory Visit: Payer: Self-pay | Admitting: Adult Health

## 2021-09-23 DIAGNOSIS — I10 Essential (primary) hypertension: Secondary | ICD-10-CM

## 2021-10-27 ENCOUNTER — Other Ambulatory Visit: Payer: Self-pay | Admitting: Adult Health

## 2021-10-27 DIAGNOSIS — I10 Essential (primary) hypertension: Secondary | ICD-10-CM

## 2022-08-20 ENCOUNTER — Other Ambulatory Visit: Payer: Self-pay | Admitting: Adult Health

## 2022-08-20 DIAGNOSIS — I10 Essential (primary) hypertension: Secondary | ICD-10-CM

## 2023-01-24 ENCOUNTER — Other Ambulatory Visit: Payer: Self-pay | Admitting: Adult Health

## 2023-01-24 DIAGNOSIS — I10 Essential (primary) hypertension: Secondary | ICD-10-CM

## 2023-03-02 ENCOUNTER — Other Ambulatory Visit: Payer: Self-pay | Admitting: Adult Health

## 2023-03-02 DIAGNOSIS — Z1212 Encounter for screening for malignant neoplasm of rectum: Secondary | ICD-10-CM

## 2023-03-02 DIAGNOSIS — Z1211 Encounter for screening for malignant neoplasm of colon: Secondary | ICD-10-CM

## 2023-04-11 ENCOUNTER — Other Ambulatory Visit: Payer: Self-pay | Admitting: Adult Health

## 2023-04-11 DIAGNOSIS — I10 Essential (primary) hypertension: Secondary | ICD-10-CM
